# Patient Record
Sex: Male | Born: 1970 | Race: White | Hispanic: No | Marital: Married | Smoking: Former smoker
Health system: Southern US, Community
[De-identification: ages and names within clinical notes are randomized; demographics above are authoritative.]

## PROBLEM LIST (undated history)

## (undated) DIAGNOSIS — T7840XA Allergy, unspecified, initial encounter: Secondary | ICD-10-CM

## (undated) DIAGNOSIS — K4090 Unilateral inguinal hernia, without obstruction or gangrene, not specified as recurrent: Secondary | ICD-10-CM

## (undated) DIAGNOSIS — I10 Essential (primary) hypertension: Secondary | ICD-10-CM

## (undated) DIAGNOSIS — Z800 Family history of malignant neoplasm of digestive organs: Secondary | ICD-10-CM

## (undated) DIAGNOSIS — K219 Gastro-esophageal reflux disease without esophagitis: Secondary | ICD-10-CM

## (undated) DIAGNOSIS — R0981 Nasal congestion: Secondary | ICD-10-CM

## (undated) DIAGNOSIS — Z83719 Family history of colon polyps, unspecified: Secondary | ICD-10-CM

## (undated) DIAGNOSIS — Z8371 Family history of colonic polyps: Secondary | ICD-10-CM

## (undated) HISTORY — DX: Allergy, unspecified, initial encounter: T78.40XA

## (undated) HISTORY — DX: Family history of colon polyps, unspecified: Z83.719

## (undated) HISTORY — DX: Family history of colonic polyps: Z83.71

## (undated) HISTORY — DX: Family history of malignant neoplasm of digestive organs: Z80.0

---

## 1993-10-18 HISTORY — PX: WISDOM TOOTH EXTRACTION: SHX21

## 2013-06-09 ENCOUNTER — Emergency Department (HOSPITAL_COMMUNITY)
Admission: EM | Admit: 2013-06-09 | Discharge: 2013-06-09 | Disposition: A | Discharge status: Home / Self Care | Payer: BC Managed Care – PPO | Attending: Emergency Medicine | Admitting: Emergency Medicine

## 2013-06-09 ENCOUNTER — Encounter (HOSPITAL_COMMUNITY): Payer: Self-pay | Admitting: Emergency Medicine

## 2013-06-09 ENCOUNTER — Emergency Department (HOSPITAL_COMMUNITY): Payer: BC Managed Care – PPO

## 2013-06-09 DIAGNOSIS — S8391XA Sprain of unspecified site of right knee, initial encounter: Secondary | ICD-10-CM

## 2013-06-09 DIAGNOSIS — IMO0002 Reserved for concepts with insufficient information to code with codable children: Secondary | ICD-10-CM | POA: Insufficient documentation

## 2013-06-09 DIAGNOSIS — Y9367 Activity, basketball: Secondary | ICD-10-CM | POA: Insufficient documentation

## 2013-06-09 DIAGNOSIS — R296 Repeated falls: Secondary | ICD-10-CM | POA: Insufficient documentation

## 2013-06-09 DIAGNOSIS — Y929 Unspecified place or not applicable: Secondary | ICD-10-CM | POA: Insufficient documentation

## 2013-06-09 DIAGNOSIS — Z880 Allergy status to penicillin: Secondary | ICD-10-CM | POA: Insufficient documentation

## 2013-06-09 MED ORDER — NAPROXEN 500 MG PO TABS: 500.0000 mg | ORAL_TABLET | Freq: Two times a day (BID) | ORAL | Status: DC

## 2013-06-09 MED ORDER — HYDROCODONE-ACETAMINOPHEN 5-325 MG PO TABS: 1.0000 | ORAL_TABLET | Freq: Four times a day (QID) | ORAL | Status: DC | PRN

## 2013-06-09 NOTE — ED Provider Notes (Signed)
CSN: [id number]     Arrival date & time 06/09/13  1038 History     First MD Initiated Contact with Patient 06/09/13 1042     Chief Complaint  Patient presents with  . Knee Injury   (Consider location/radiation/quality/duration/timing/severity/associated sxs/prior Treatment) HPI Patient is a 42 year old male who presented to emergency department today with complaint of right knee injury. Patient states he injured his knee yesterday while playing basketball. Patient states he went to the lab to get the ball, and states felt like his right knee buckled, and pops, and he ended up falling down to the ground in severe pain. Patient states when he looked at his knee it did not look just like it was dislocated. Patient states he wasn't able to move it or put any weight on it for about 30 minutes. Patient states he applied ice pack and took ibuprofen for it. Patient states that he was able to put weight on it and ambulate with a limp after about half an hour. States today he feels like swelling is getting worse, and pain is not improving. Patient states any time that he takes the wrong turn a wrong step it feels like his knee is gone buccle and "pop out to the outside." Patient denies any prior knee injuries. Patient denies any other complaints. History reviewed. No pertinent past medical history. History reviewed. No pertinent past surgical history. History reviewed. No pertinent family history. History  Substance Use Topics  . Smoking status: Unknown If Ever Smoked  . Smokeless tobacco: Not on file  . Alcohol Use: Not on file    Review of Systems  Constitutional: Negative for fever and chills.  HENT: Negative for neck pain and neck stiffness.   Genitourinary: Negative for dysuria, urgency, frequency and hematuria.  Musculoskeletal: Positive for joint swelling and arthralgias.  Allergic/Immunologic: Negative for immunocompromised state.  Neurological: Negative for dizziness, weakness,  light-headedness, numbness and headaches.    Allergies  Penicillins  Home Medications   Current Outpatient Rx  Name  Route  Sig  Dispense  Refill  . aspirin 325 MG tablet   Oral   Take 325 mg by mouth every 4 (four) hours as needed (headache).         Marland Kitchen ibuprofen (ADVIL,MOTRIN) 200 MG tablet   Oral   Take 600 mg by mouth every 6 (six) hours as needed for pain.         . Oxycodone-Acetaminophen (PERCOCET PO)   Oral   Take 1 tablet by mouth daily as needed (pain).          BP 129/87  Pulse 62  Temp(Src) 98.5 F (36.9 C) (Oral)  Resp 16  SpO2 99% Physical Exam  Nursing note and vitals reviewed. Constitutional: He appears well-developed and well-nourished. No distress.  HENT:  Head: Normocephalic and atraumatic.  Eyes: Conjunctivae are normal.  Neck: Neck supple.  Musculoskeletal: He exhibits no edema.  Normal appearing right knee. Tender to palpation over lateral joint. Full ROM of the knee joint. Pain with full flexion and extension. Negative anterior and posterior drawer signs. No laxity  with medial or lateral stress. Pain with medial stress. Dorsal pedal pulses intact  Neurological: He is alert.  Skin: Skin is warm and dry.    ED Course   Procedures (including critical care time)  Labs Reviewed - No data to display Dg Knee Complete 4 Views Right  06/09/2013   CLINICAL DATA:  Lateral knee pain after injury yesterday playing basketball.  EXAM:  RIGHT KNEE - COMPLETE 4+ VIEW  COMPARISON:  None.  FINDINGS: Small but abnormal knee effusion. No fracture or acute bony findings identified.  IMPRESSION: 1. Small but abnormal knee effusion.   Electronically Signed   By: Herbie Baltimore   On: 06/09/2013 11:32   1. Right knee sprain, initial encounter     MDM  Right knee injury yesterday. Patient's x-ray shows small knee effusion no other abnormalities. The knee is stable on exam however patient states it does feel unstable when he is walking. I am concerned for  possible ligament injury. Will apply a knee immobilizer, he has his own crutches she will be using. Will discharge home with Naprosyn, Norco, and followup with orthopedics.  Filed Vitals:   06/09/13 1048  BP: 129/87  Pulse: 62  Temp: 98.5 F (36.9 C)  TempSrc: Oral  Resp: 16  SpO2: 99%     Vanessia Bokhari A Letoya Stallone, PA-C 06/09/13 1406

## 2013-06-09 NOTE — ED Notes (Signed)
Pt returned from X-ray.  

## 2013-06-09 NOTE — ED Provider Notes (Signed)
Medical screening examination/treatment/procedure(s) were performed by non-physician practitioner and as supervising physician I was immediately available for consultation/collaboration.   Dagmar Hait, MD 06/09/13 [phone]

## 2013-06-09 NOTE — ED Notes (Signed)
Pt coming from home c/o rt knee pain from playing basketball and felt knee "pop". Pt states " I think it popped out". Pt is able to bare weight but feels like it is going to pop out when straightening.Took 3 200mg  ibuprofen 2 hrs ago. Pt used ice to help with swelling.

## 2013-06-09 NOTE — ED Notes (Signed)
Crutch instructions given-pt stated understood.

## 2013-06-25 ENCOUNTER — Ambulatory Visit: Payer: Self-pay | Admitting: Orthopedic Surgery

## 2014-03-18 IMAGING — CR DG KNEE COMPLETE 4+V*R*
4 series · 4 of 4 positions shown · non-contrast
Comparison: None.

CLINICAL DATA: Lateral knee pain after injury yesterday playing
basketball.

EXAM:
RIGHT KNEE - COMPLETE 4+ VIEW

[t knee ap right]
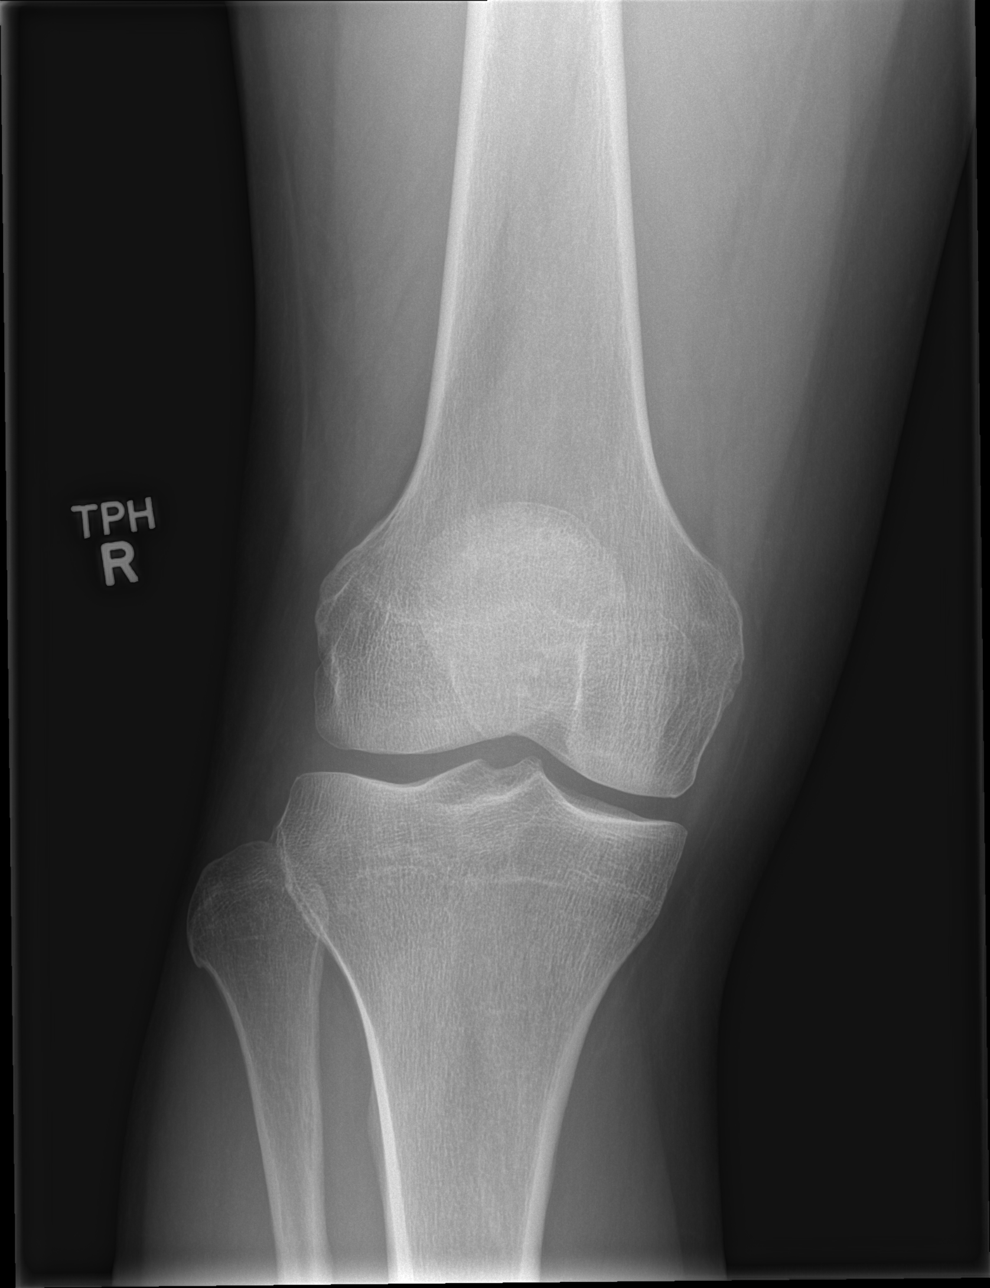

[t knee obl right (1 of 2)]
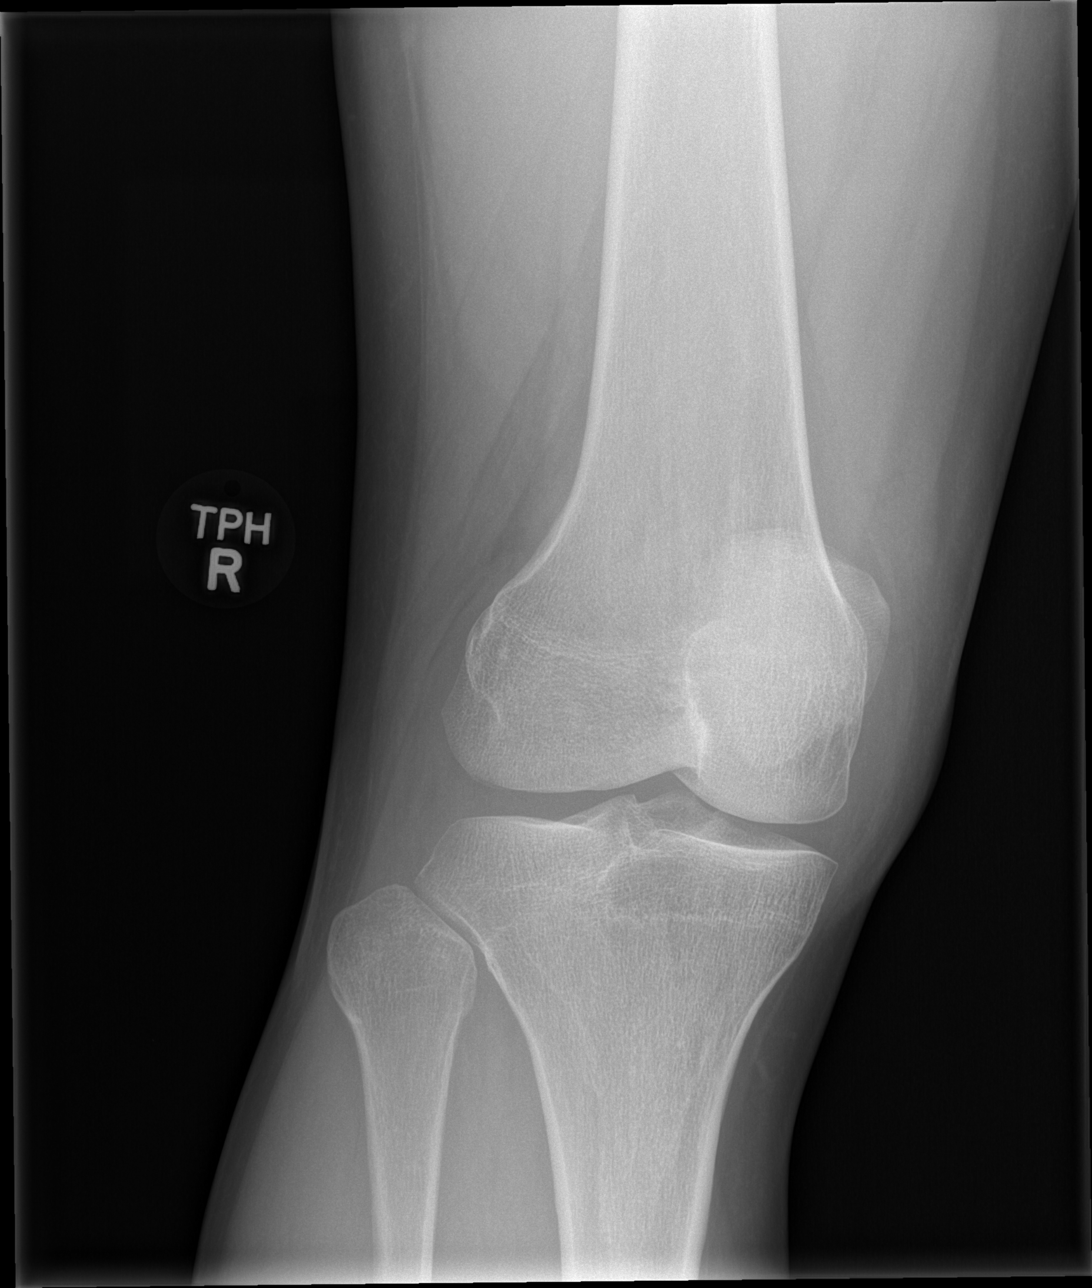

[t knee obl right (2 of 2)]
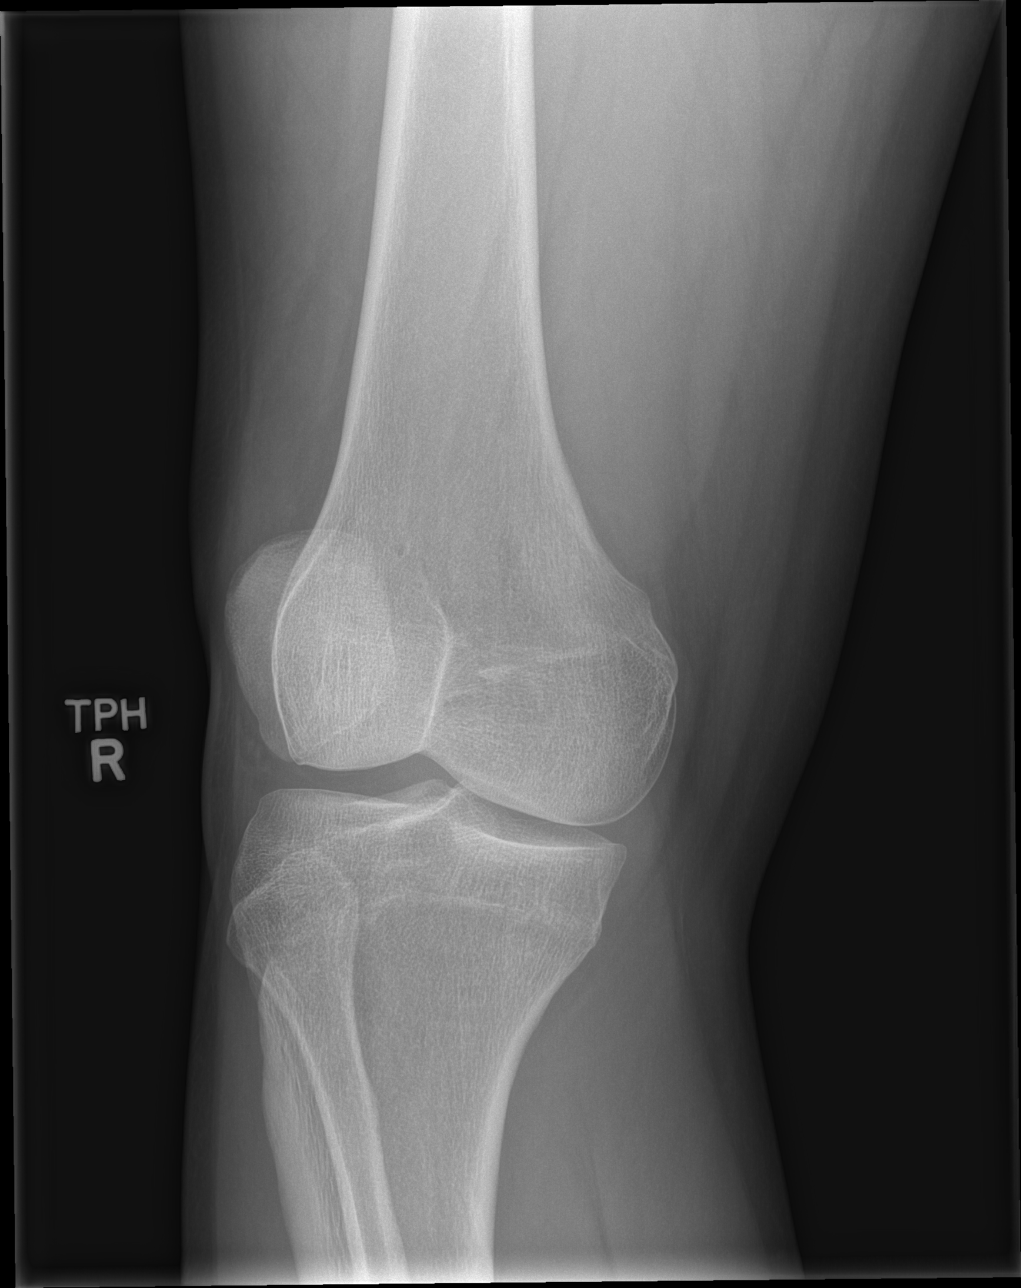

[t knee lat right]
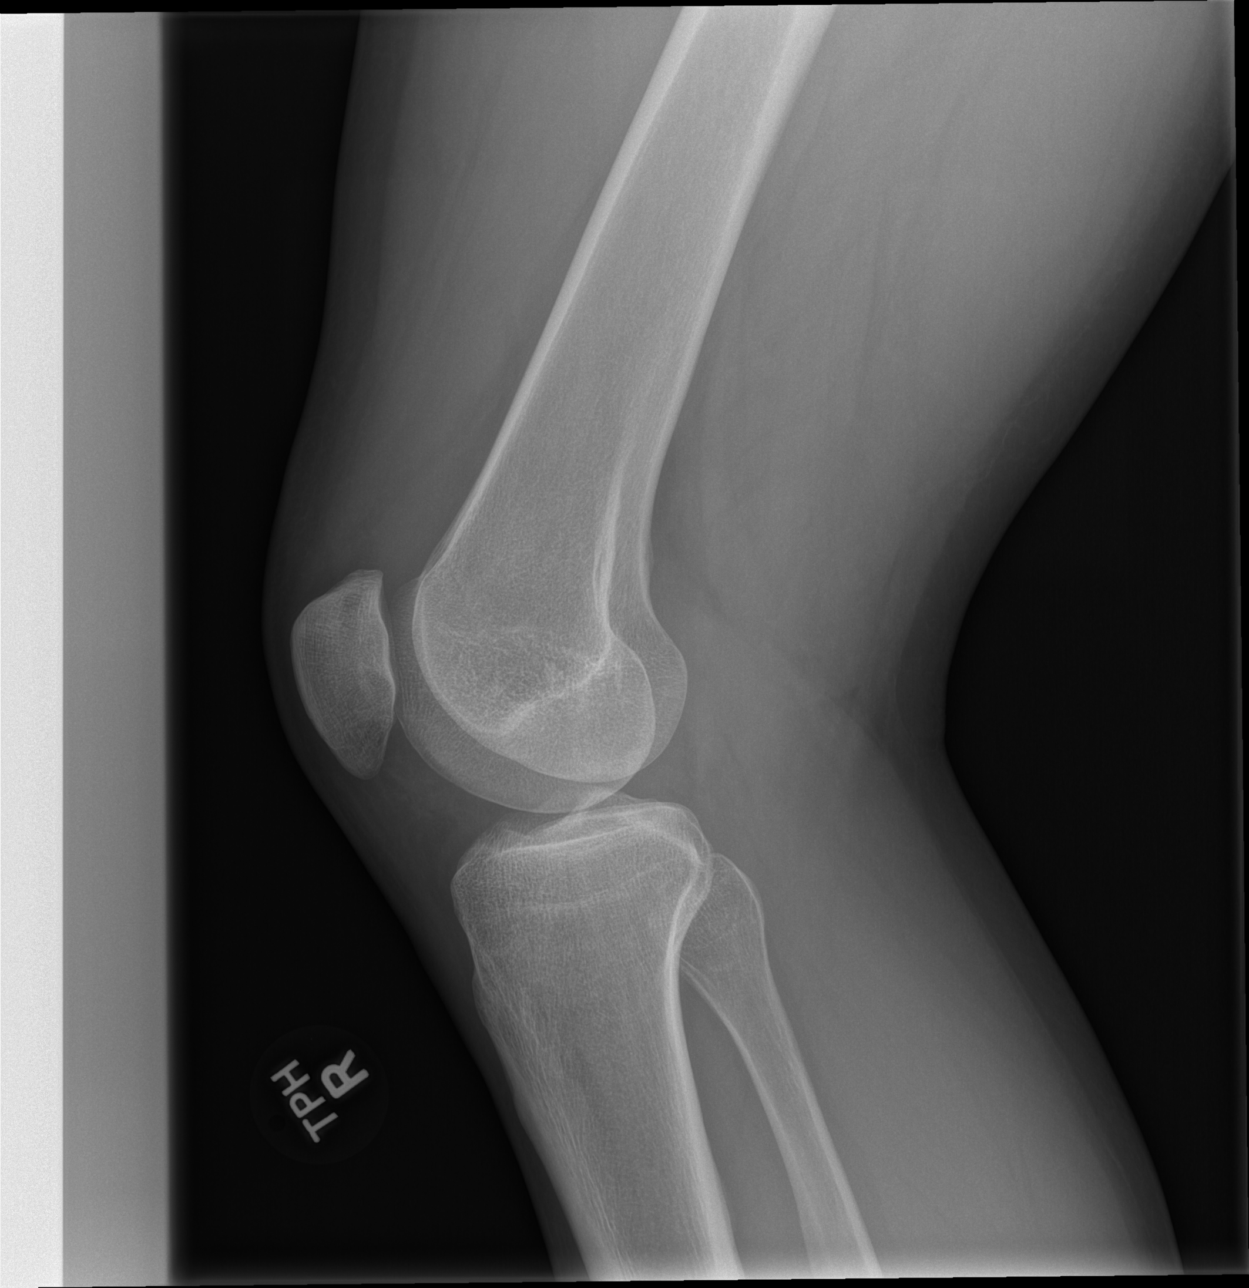

[4 of 4 positions shown; findings below may reference images not displayed]

FINDINGS: Small but abnormal knee effusion. No fracture or acute bony findings
identified.
IMPRESSION: 1. Small but abnormal knee effusion.

## 2016-01-27 ENCOUNTER — Encounter: Payer: Self-pay | Admitting: *Deleted

## 2016-02-17 ENCOUNTER — Encounter: Payer: Self-pay | Admitting: General Surgery

## 2016-02-17 ENCOUNTER — Ambulatory Visit (INDEPENDENT_AMBULATORY_CARE_PROVIDER_SITE_OTHER): Payer: Self-pay | Admitting: General Surgery

## 2016-02-17 VITALS — BP 140/90 | HR 78 | Resp 14 | Ht 71.0 in | Wt 243.0 lb

## 2016-02-17 DIAGNOSIS — K439 Ventral hernia without obstruction or gangrene: Secondary | ICD-10-CM

## 2016-02-17 NOTE — Progress Notes (Addendum)
Patient ID: Keith York, male   DOB: Aug 13, 1971, 45 y.o.   MRN: [id number]  Chief Complaint  Patient presents with  . Hernia    HPI Keith York is a 45 y.o. male.  Here today for evaluation of possible hernia. He states he has a knot when he stands up at his belly button that he noticed in March. He states that around Christmas he had an episode of abdominal pain/ "pressure", fever and nausea with shortness of breath, but no knot. He has had several more episodes but not as severe, where he has to lay down and relax before he feels better.  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  He is here today with his wife, Keith York of 20 years. Four children- 16, 14 and twins are 9.  The patient was accompanied by his wife of 20 years, Keith York.  I personally reviewed the patient's history.  HPI  Past Medical History  Diagnosis Date  . Allergy     Past Surgical History  Procedure Laterality Date  . [place] tooth extraction  1995    Family History  Problem Relation Age of Onset  . Arthritis/Rheumatoid Father   . Dementia Father     Social History Social History  Substance Use Topics  . Smoking status: Former Research scientist (life sciences)  . Smokeless tobacco: Never Used  . Alcohol Use: No    Allergies  Allergen Reactions  . Penicillins Rash    Current Outpatient Prescriptions  Medication Sig Dispense Refill  . ibuprofen (ADVIL,MOTRIN) 200 MG tablet Take 600 mg by mouth every 6 (six) hours as needed for pain.    Marland Kitchen loratadine (CLARITIN) 10 MG tablet Take 10 mg by mouth daily.     No current facility-administered medications for this visit.    Review of Systems Review of Systems  Constitutional: Negative.   Respiratory: Negative.   Cardiovascular: Negative.   Gastrointestinal: Positive for abdominal pain.    Blood pressure 140/90, pulse 78, resp. rate 14, height 5\' 11"  (1.803 m), weight 243 lb (110.224 kg).  Physical Exam Physical  Exam  Constitutional: He is oriented to person, place, and time. He appears well-developed and well-nourished.  HENT:  Mouth/Throat: Oropharynx is clear and moist.  Eyes: Conjunctivae are normal. No scleral icterus.  Neck: Neck supple.  Cardiovascular: Normal rate, regular rhythm and normal heart sounds.   Pulmonary/Chest: Effort normal and breath sounds normal.  Abdominal: Soft. Normal appearance and bowel sounds are normal. There is no tenderness. A hernia is present.    Epigastric hernia present.  Lymphadenopathy:    He has no cervical adenopathy.  Neurological: He is alert and oriented to person, place, and time.  Skin: Skin is warm and dry.  Psychiatric: His behavior is normal.    Data Reviewed 01/22/2016: Ultrasound of the abdominal wall showed a soft tissue mass containing adipose tissue with internal vascular flow.  Assessment    Epigastric hernia.    Plan    No anticipated risk for incarceration as this is likely a pre-peritoneal fat pad protruding through a small fascial defect.     Hernia precautions and incarceration were discussed with the patient. If they develop symptoms of an incarcerated hernia, they were encouraged to seek prompt medical attention.  I have recommended repair of the hernia using mesh on an outpatient basis in the near future. The risk of infection was reviewed. The role of prosthetic mesh to  minimize the risk of recurrence was reviewed.  He will need to avoid strenuous activity for 3 weeks.  Patient to be contacted once August surgery schedule opens.   PCP:  Keith York Ref Keith Pulling PA  This information has been scribed by Keith Fetch RN, BSN,BC.   Keith York 02/24/2016, 5:15 PM

## 2016-02-17 NOTE — Patient Instructions (Addendum)
The patient is aware to call back for any questions or concerns. Hernia, Adult A hernia is the bulging of an organ or tissue through a weak spot in the muscles of the abdomen (abdominal wall). Hernias develop most often near the navel or groin. There are many kinds of hernias. Common kinds include:  Femoral hernia. This kind of hernia develops under the groin in the upper thigh area.  Inguinal hernia. This kind of hernia develops in the groin or scrotum.  Umbilical hernia. This kind of hernia develops near the navel.  Hiatal hernia. This kind of hernia causes part of the stomach to be pushed up into the chest.  Incisional hernia. This kind of hernia bulges through a scar from an abdominal surgery. CAUSES This condition may be caused by:  Heavy lifting.  Coughing over a long period of time.  Straining to have a bowel movement.  An incision made during an abdominal surgery.  A birth defect (congenital defect).  Excess weight or obesity.  Smoking.  Poor nutrition.  Cystic fibrosis.  Excess fluid in the abdomen.  Undescended testicles. SYMPTOMS Symptoms of a hernia include:  A lump on the abdomen. This is the first sign of a hernia. The lump may become more obvious with standing, straining, or coughing. It may get bigger over time if it is not treated or if the condition causing it is not treated.  Pain. A hernia is usually painless, but it may become painful over time if treatment is delayed. The pain is usually dull and may get worse with standing or lifting heavy objects. Sometimes a hernia gets tightly squeezed in the weak spot (strangulated) or stuck there (incarcerated) and causes additional symptoms. These symptoms may include:  Vomiting.  Nausea.  Constipation.  Irritability. DIAGNOSIS A hernia may be diagnosed with:  A physical exam. During the exam your health care provider may ask you to cough or to make a specific movement, because a hernia is usually  more visible when you move.  Imaging tests. These can include:  X-rays.  Ultrasound.  CT scan. TREATMENT A hernia that is small and painless may not need to be treated. A hernia that is large or painful may be treated with surgery. Inguinal hernias may be treated with surgery to prevent incarceration or strangulation. Strangulated hernias are always treated with surgery, because lack of blood to the trapped organ or tissue can cause it to die. Surgery to treat a hernia involves pushing the bulge back into place and repairing the weak part of the abdomen. HOME CARE INSTRUCTIONS  Avoid straining.  Do not lift anything heavier than 10 lb (4.5 kg).  Lift with your leg muscles, not your back muscles. This helps avoid strain.  When coughing, try to cough gently.  Prevent constipation. Constipation leads to straining with bowel movements, which can make a hernia worse or cause a hernia repair to break down. You can prevent constipation by:  Eating a high-fiber diet that includes plenty of fruits and vegetables.  Drinking enough fluids to keep your urine clear or pale yellow. Aim to drink 6-8 glasses of water per day.  Using a stool softener as directed by your health care provider.  Lose weight, if you are overweight.  Do not use any tobacco products, including cigarettes, chewing tobacco, or electronic cigarettes. If you need help quitting, ask your health care provider.  Keep all follow-up visits as directed by your health care provider. This is important. Your health care provider may   need to monitor your condition. SEEK MEDICAL CARE IF:  You have swelling, redness, and pain in the affected area.  Your bowel habits change. SEEK IMMEDIATE MEDICAL CARE IF:  You have a fever.  You have abdominal pain that is getting worse.  You feel nauseous or you vomit.  You cannot push the hernia back in place by gently pressing on it while you are lying down.  The hernia:  Changes in  shape or size.  Is stuck outside the abdomen.  Becomes discolored.  Feels hard or tender.   This information is not intended to replace advice given to you by your health care provider. Make sure you discuss any questions you have with your health care provider.   Document Released: 10/04/2005 Document Revised: 10/25/2014 Document Reviewed: 08/14/2014 Elsevier Interactive Patient Education 2016 [employer].  Patient to be contacted once August surgery schedule opens.

## 2016-02-18 DIAGNOSIS — K439 Ventral hernia without obstruction or gangrene: Secondary | ICD-10-CM | POA: Insufficient documentation

## 2016-02-20 ENCOUNTER — Telehealth: Payer: Self-pay | Admitting: *Deleted

## 2016-02-20 NOTE — Telephone Encounter (Signed)
Patient wants you to call him regarding his umbilical hernia. For the last couple of days he has been experiencing deep sharpe pain in the abdominal area and is bloated. He is worried that it could be more than just a hernia.

## 2016-03-05 ENCOUNTER — Encounter: Payer: Self-pay | Admitting: General Surgery

## 2016-04-29 ENCOUNTER — Telehealth: Payer: Self-pay | Admitting: *Deleted

## 2016-04-29 NOTE — Telephone Encounter (Signed)
Patient's wife, Chidi Chann, was contacted today to arrange epigastric hernia repair for September 2017.  This patient's surgery has been scheduled for 06-23-16 at [facility]. Instructions reviewed by phone today. Patient's wife instructed to call the office should they have further questions.

## 2016-06-11 ENCOUNTER — Other Ambulatory Visit: Payer: Self-pay | Admitting: General Surgery

## 2016-06-16 ENCOUNTER — Encounter
Admission: RE | Admit: 2016-06-16 | Discharge: 2016-06-16 | Disposition: A | Discharge status: Home / Self Care | Payer: Self-pay | Source: Ambulatory Visit | Attending: General Surgery | Admitting: General Surgery

## 2016-06-16 HISTORY — DX: Nasal congestion: R09.81

## 2016-06-16 NOTE — Patient Instructions (Signed)
  Your procedure is scheduled on: 06/23/16 Report to Day Surgery. [facility] SECOND FLOOR To find out your arrival time please call [phone] between 1PM - 3PM on 06/22/16  Remember: Instructions that are not followed completely may result in serious medical risk, up to and including death, or upon the discretion of your surgeon and anesthesiologist your surgery may need to be rescheduled.    __X__ 1. Do not eat food or drink liquids after midnight. No gum chewing or hard candies.     _X___ 2. No Alcohol for 24 hours before or after surgery.   ____ 3. Do Not Smoke For 24 Hours Prior to Your Surgery.   ____ 4. Bring all medications with you on the day of surgery if instructed.    __X__ 5. Notify your doctor if there is any change in your medical condition     (cold, fever, infections).       Do not wear jewelry, make-up, hairpins, clips or nail polish.  Do not wear lotions, powders, or perfumes. You may wear deodorant.  Do not shave 48 hours prior to surgery. Men may shave face and neck.  Do not bring valuables to the hospital.    [facility] is not responsible for any belongings or valuables.               Contacts, dentures or bridgework may not be worn into surgery.  Leave your suitcase in the car. After surgery it may be brought to your room.  For patients admitted to the hospital, discharge time is determined by your                treatment team.   Patients discharged the day of surgery will not be allowed to drive home.   Please read over the following fact sheets that you were given:   Surgical Site Infection Prevention   ____ Take these medicines the morning of surgery with A SIP OF WATER:    1. NONE  2.   3.   4.  5.  6.  ____ Fleet Enema (as directed)   _X___ Use CHG Soap as directed  ____ Use inhalers on the day of surgery  ____ Stop metformin 2 days prior to surgery    ____ Take 1/2 of usual insulin dose the night before surgery and none on the morning  of surgery.   ____ Stop Coumadin/Plavix/aspirin on   ____ Stop Anti-inflammatories on     ALREADY STOPPED IBUPROFEN   ____ Stop supplements until after surgery.    ____ Bring C-Pap to the hospital.

## 2016-06-22 ENCOUNTER — Ambulatory Visit: Admission: RE | Admit: 2016-06-22 | Payer: Self-pay | Source: Ambulatory Visit | Admitting: General Surgery

## 2016-06-22 ENCOUNTER — Encounter: Admission: RE | Payer: Self-pay | Source: Ambulatory Visit

## 2016-06-22 SURGERY — REPAIR, HERNIA, EPIGASTRIC, ADULT
Anesthesia: Choice

## 2016-07-02 ENCOUNTER — Telehealth: Payer: Self-pay

## 2016-07-02 NOTE — Telephone Encounter (Signed)
Call to patient's wife to let her know that our November schedule was open. The patient is now scheduled for surgery at [facility] on 09/14/16. He will pre admit by phone. The patient is aware to call the day before to get his arrival time. He is aware of date and instructions.

## 2016-07-16 ENCOUNTER — Other Ambulatory Visit: Payer: Self-pay | Admitting: General Surgery

## 2016-08-31 ENCOUNTER — Telehealth: Payer: Self-pay

## 2016-08-31 NOTE — Telephone Encounter (Signed)
Patient's wife called to cancel his surgery. 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 She will call back to reschedule once they can find a time for this.

## 2016-09-06 ENCOUNTER — Other Ambulatory Visit: Payer: Self-pay

## 2016-09-14 ENCOUNTER — Ambulatory Visit: Admit: 2016-09-14 | Payer: Self-pay | Admitting: General Surgery

## 2016-09-14 SURGERY — REPAIR, HERNIA, EPIGASTRIC, ADULT
Anesthesia: Choice

## 2020-03-10 ENCOUNTER — Other Ambulatory Visit: Payer: Self-pay | Admitting: Otolaryngology

## 2020-03-10 DIAGNOSIS — R221 Localized swelling, mass and lump, neck: Secondary | ICD-10-CM

## 2020-03-12 ENCOUNTER — Ambulatory Visit
Admission: RE | Admit: 2020-03-12 | Discharge: 2020-03-12 | Disposition: A | Discharge status: Home / Self Care | Payer: Self-pay | Source: Ambulatory Visit | Attending: Otolaryngology | Admitting: Otolaryngology

## 2020-03-12 ENCOUNTER — Other Ambulatory Visit: Payer: Self-pay

## 2020-03-12 DIAGNOSIS — R221 Localized swelling, mass and lump, neck: Secondary | ICD-10-CM

## 2020-03-12 MED ORDER — IOHEXOL 300 MG/ML  SOLN
75.0000 mL | Freq: Once | INTRAMUSCULAR | Status: AC | PRN
  Administered 2020-03-12: 75 mL via INTRAVENOUS

## 2020-11-18 ENCOUNTER — Other Ambulatory Visit: Payer: Self-pay | Admitting: General Surgery

## 2020-11-18 DIAGNOSIS — Z1509 Genetic susceptibility to other malignant neoplasm: Secondary | ICD-10-CM

## 2020-12-08 ENCOUNTER — Inpatient Hospital Stay: Payer: Self-pay

## 2020-12-08 ENCOUNTER — Encounter: Payer: Self-pay | Admitting: Licensed Clinical Social Worker

## 2020-12-08 ENCOUNTER — Inpatient Hospital Stay: Payer: Self-pay | Attending: Oncology | Admitting: Licensed Clinical Social Worker

## 2020-12-08 DIAGNOSIS — Z8371 Family history of colonic polyps: Secondary | ICD-10-CM

## 2020-12-08 DIAGNOSIS — Z800 Family history of malignant neoplasm of digestive organs: Secondary | ICD-10-CM

## 2020-12-08 NOTE — Progress Notes (Signed)
REFERRING PROVIDER: Robert Bellow, MD [place],  [place] [id number]  PRIMARY PROVIDER:  Patient, No Pcp Per  PRIMARY REASON FOR VISIT:  1. Family history of colonic polyps   2. Family history of Lynch syndrome      HISTORY OF PRESENT ILLNESS:   Keith York, a 50 y.o. male, was seen for a [place] cancer genetics consultation at the request of Dr. Bary Castilla due to a family history of Lynch syndrome.  Keith York presents to clinic today to discuss the possibility of a hereditary predisposition to cancer, genetic testing, and to further clarify his future cancer risks, as well as potential cancer risks for family members.    Keith York is a 50 y.o. male with no personal history of cancer.  He has had normal PSA screening in the past. He has a colonoscopy scheduled for 2/28.  CANCER HISTORY:  Oncology History   No history exists.     Past Medical History:  Diagnosis Date  . Allergy   . Family history of colonic polyps   . Family history of Lynch syndrome   . Sinus congestion     Past Surgical History:  Procedure Laterality Date  . [place] EXTRACTION  1995    Social History   Socioeconomic History  . Marital status: Married    Spouse name: Not on file  . Number of children: Not on file  . Years of education: Not on file  . Highest education level: Not on file  Occupational History  . Not on file  Tobacco Use  . Smoking status: Former Smoker    Quit date: 06/16/1994    Years since quitting: 26.4  . Smokeless tobacco: Never Used  Substance and Sexual Activity  . Alcohol use: No    Alcohol/week: 0.0 standard drinks  . Drug use: No  . Sexual activity: Not on file  Other Topics Concern  . Not on file  Social History Narrative  . Not on file   Social Determinants of Health   Financial Resource Strain: Not on file  Food Insecurity: Not on file  Transportation Needs: Not on file  Physical Activity: Not on file  Stress: Not on file   Social Connections: Not on file     FAMILY HISTORY:  We obtained a detailed, 4-generation family history.  Significant diagnoses are listed below: Family History  Problem Relation Age of Onset  . Arthritis/Rheumatoid Father   . Dementia Father   . Colon polyps Brother        PMS2+ Lynch syndrome  . Lung cancer Maternal Grandmother    Keith York has 3 daughters and 1 son. He has 1 full brother, 1 maternal half brother. His full brother has had >20 colon polyps and underwent genetic testing that revealed a PMS2 mutation.   Keith York mother is living at 24, no cancer. No maternal aunts/uncles. Maternal grandmother died at 28, lung cancer. Maternal grandfather died in his 49s.   Keith York father died of dementia in his 66s. Patient has 1 paternal uncle, no cancer. No known cancers in paternal cousins. Paternal grandparents passed in their 79s.   Keith York is aware of previous family history of genetic testing for hereditary cancer risks. Patient's maternal ancestors are of [place] descent, and paternal ancestors are of [place] descent. There is no reported Ashkenazi Jewish ancestry. There is no known consanguinity.    GENETIC COUNSELING ASSESSMENT: Keith York is a 50 y.o. male with a family history  of Lynch syndrome. We, therefore, discussed and recommended the following at today's visit.   DISCUSSION: DISCUSSION: We discussed that 5-7% of colon cancer is hereditary, with most cases due to Lynch Syndrome, also called HNPCC (Hereditary Non-Polyposis Colon Cancer).  This syndrome increases the risk for colon, uterine, ovarian and stomach cancers, brain cancers, as well as others. Families with Lynch Syndrome tend to have multiple family members with these cancers, typically diagnosed under age 53, and diagnoses in multiple generations. The genes that are known to cause Lynch Syndrome are called MLH1, MSH2, MSH6, PMS2 and EPCAM. The mutation in the family is in the PMS2 gene.  We  reviewed the characteristics, features and inheritance patterns of hereditary cancer syndromes. We also discussed genetic testing, including the appropriate family members to test, the process of testing, insurance coverage and turn-around-time for results. We discussed the implications of a negative, positive and/or variant of uncertain significant result. We recommended Keith York pursue genetic testing for the PMS2 mutation through [facility].   Based on Keith York family history of Donnal York, he meets medical criteria for genetic testing. Despite that he meets criteria, he may still have an out of pocket cost. We discussed that if his out of pocket cost for testing is over $100, the laboratory will call and confirm whether he wants to proceed with testing.  If the out of pocket cost of testing is less than $100 he will be billed by the genetic testing laboratory.   PLAN: After considering the risks, benefits, and limitations, Keith York provided informed consent to pursue genetic testing and the blood sample was sent to [facility] for analysis of the PMS2 gene. Results should be available within approximately 2-3 weeks' time, at which point they will be disclosed by telephone to Keith York, as will any additional recommendations warranted by these results. Keith York will receive a summary of his genetic counseling visit and a copy of his results once available. This information will also be available in Epic.   Keith York were answered to his satisfaction today. Our contact information was provided should additional York or concerns arise. Thank you for the referral and allowing [place] to share in the care of your patient.   Faith Rogue, MS, [facility] Genetic Counselor [email or web address] Phone: [phone]  The patient was seen for a total of 35 minutes in face-to-face genetic counseling. Patient's wife Steffanie Dunn was also present. Dr. Grayland Ormond was available for discussion  regarding this case.   _______________________________________________________________________ For Office Staff:  Number of people involved in session: 2 Was an Intern/ student involved with case: no

## 2020-12-19 IMAGING — CT CT NECK W/ CM
2 of 3 series · 8 of 14 positions shown, 10 images · IV contrast (omnipaque)
Comparison: None.

CLINICAL DATA: Left neck swelling.

EXAM:
CT NECK WITH CONTRAST
TECHNIQUE: Multidetector CT imaging of the neck was performed using the
standard protocol following the bolus administration of intravenous
contrast.
CONTRAST:  75mL OMNIPAQUE IOHEXOL 300 MG/ML  SOLN

[Series 2: axial neck · axial · 0.62mm/px · z∈[-461,-329]mm · 3 of 132 slices shown]
[im 33/132  bone]
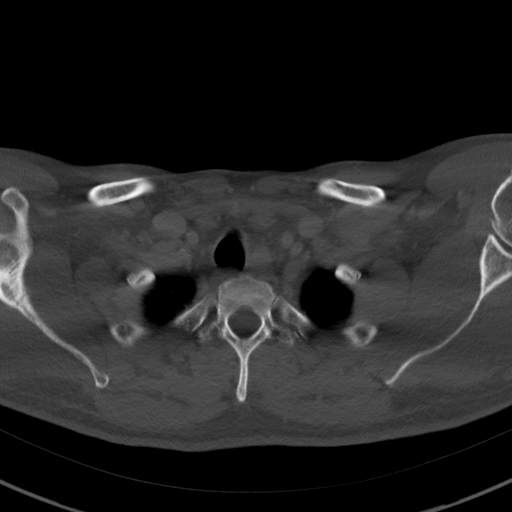
[im 66/132  bone]
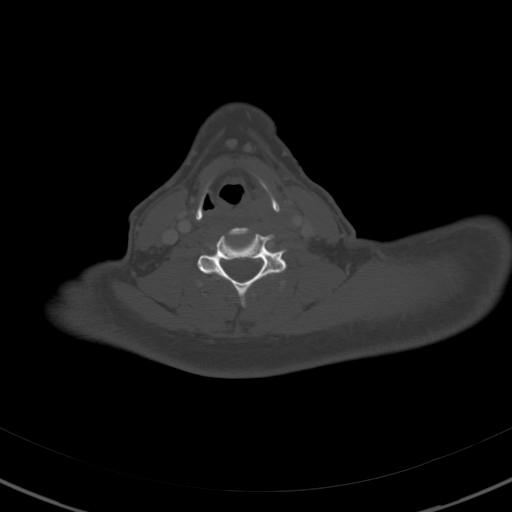
[im 99/132  bone]
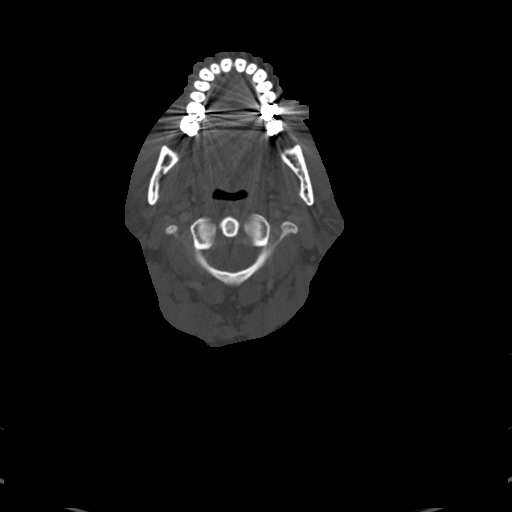

[Series 7: orthogonal ax · axial · 0.36mm/px · z∈[-525,-316]mm · 5 of 161 slices shown, 7 images]
[im 27/161  soft-tissue]
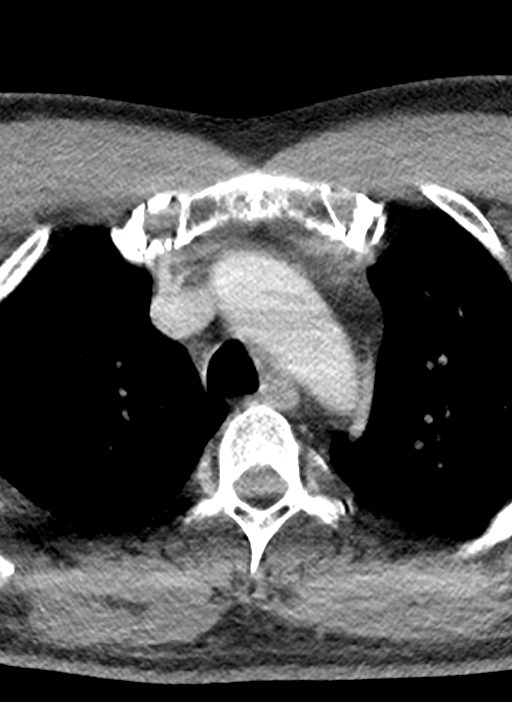
[im 27/161  bone]
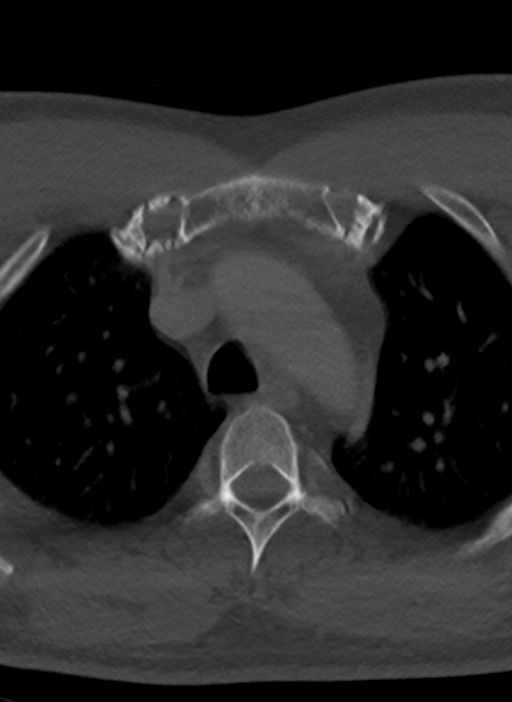
[im 54/161  bone]
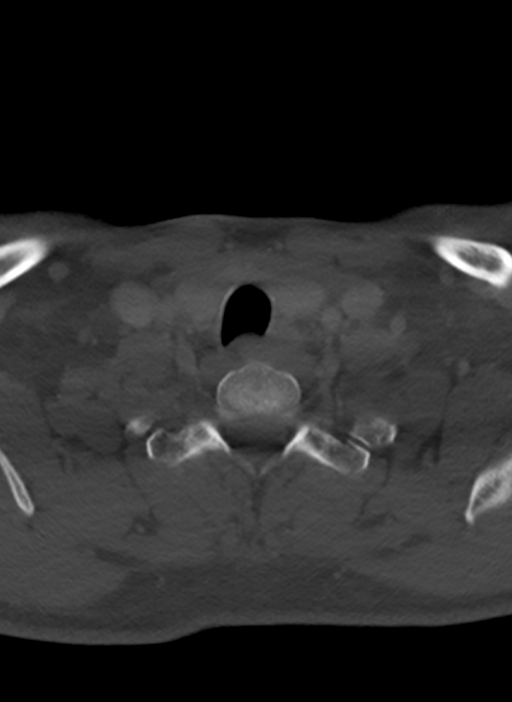
[im 81/161  bone]
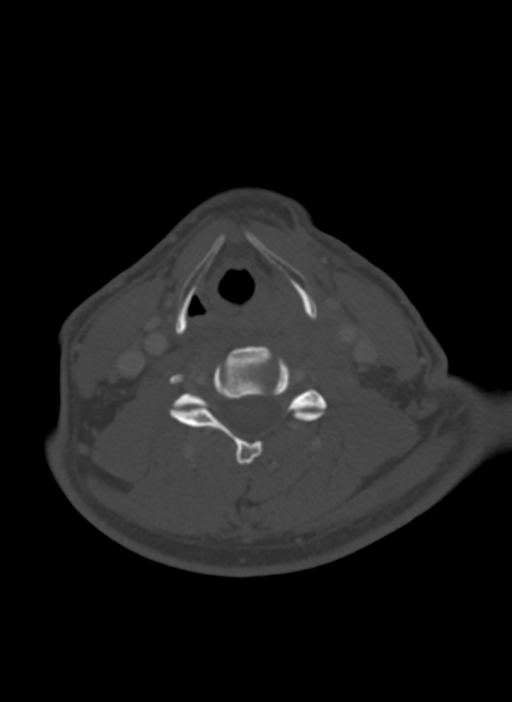
[im 107/161  bone]
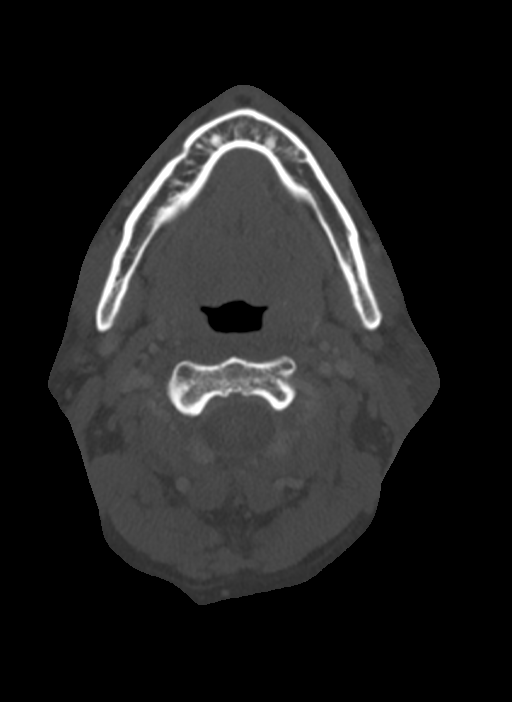
[im 134/161  soft-tissue]
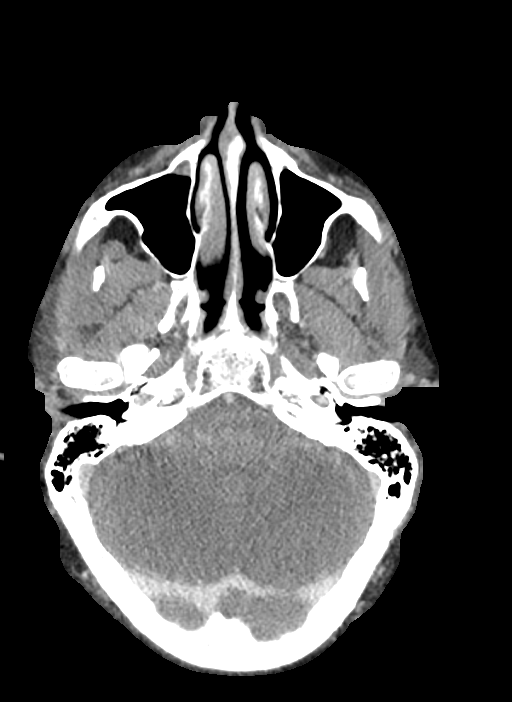
[im 134/161  bone]
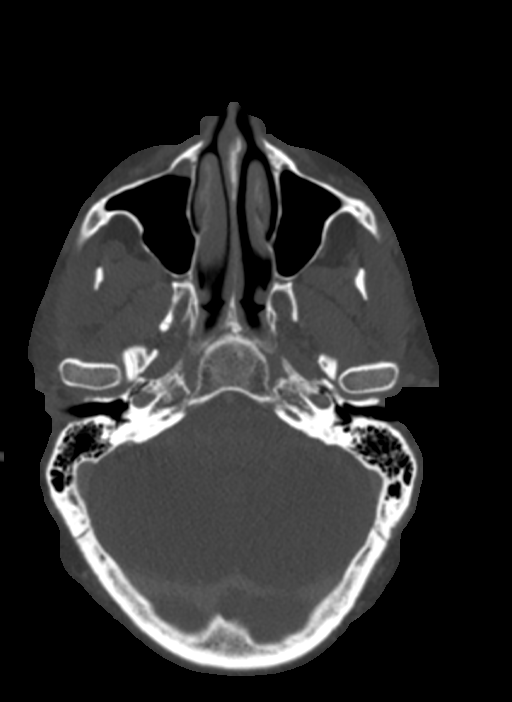

[8 of 14 positions shown; findings below may reference images not displayed]

FINDINGS: Pharynx and larynx: There is a small retropharyngeal effusion. More
laterally at the level of and surrounding the left superior cornu of
the thyroid cartilage, there is a more focal region of
heterogeneously intermediate to low density soft tissue measuring
2.5 cm which mildly laterally displaces the left common carotid
artery but does not completely encircle or narrow it. Soft tissue
thickening partially extends more inferiorly primarily along the
deep aspect of the thyroid cartilage, and the left piriform sinus is
partially effaced.

Salivary glands: No inflammation, mass, or stone.

Thyroid: Unremarkable.

Lymph nodes: No enlarged or suspicious lymph nodes in the neck.

Vascular: Major vascular structures of the neck are patent.

Limited intracranial: Unremarkable.

Visualized orbits: Unremarkable.

Mastoids and visualized paranasal sinuses: Minimal right maxillary
sinus mucosal thickening. Clear mastoid air cells.

Skeleton: Mild cervical spondylosis and upper right cervical facet
arthrosis.

Upper chest: Clear lung apices.

Other: None.
IMPRESSION: 2.5 cm region of soft tissue surrounding the left superior cornu of
the thyroid cartilage. A neoplasm (such as chondroma) is possible,
however given that the underlying thyroid cartilage is intact and
there is a small retropharyngeal effusion, an inflammatory process
is also a consideration.

## 2021-01-01 ENCOUNTER — Ambulatory Visit: Payer: Self-pay | Admitting: Licensed Clinical Social Worker

## 2021-01-01 ENCOUNTER — Encounter: Payer: Self-pay | Admitting: Licensed Clinical Social Worker

## 2021-01-01 DIAGNOSIS — Z1379 Encounter for other screening for genetic and chromosomal anomalies: Secondary | ICD-10-CM

## 2021-01-01 DIAGNOSIS — Z800 Family history of malignant neoplasm of digestive organs: Secondary | ICD-10-CM

## 2021-01-01 DIAGNOSIS — Z8371 Family history of colonic polyps: Secondary | ICD-10-CM

## 2021-01-01 NOTE — Progress Notes (Signed)
HPI:  Mr. Vandenbrink was previously seen in the [facility] due to a family history of Lynch syndrome and concerns regarding a hereditary predisposition to cancer. Please refer to our prior cancer genetics clinic note for more information regarding our discussion, assessment and recommendations, at the time. Mr. Bilbo's recent genetic test results were disclosed to him, as were recommendations warranted by these results. These results and recommendations are discussed in more detail below.  CANCER HISTORY:  Oncology History   No history exists.    FAMILY HISTORY:  We obtained a detailed, 4-generation family history.  Significant diagnoses are listed below: Family History  Problem Relation Age of Onset  . Arthritis/Rheumatoid Father   . Dementia Father   . Colon polyps Brother        PMS2+ Lynch syndrome  . Lung cancer Maternal Grandmother     Mr. Gupton has 3 daughters and 1 son. He has 1 full brother, 1 maternal half brother. His full brother has had >20 colon polyps and underwent genetic testing that revealed a PMS2 mutation.   Mr. Platas's mother is living at 76, no cancer. No maternal aunts/uncles. Maternal grandmother died at 93, lung cancer. Maternal grandfather died in his 60s.   Mr. Paget's father died of dementia in his 50s. Patient has 1 paternal uncle, no cancer. No known cancers in paternal cousins. Paternal grandparents passed in their 70s.   Mr. Nilsson is aware of previous family history of genetic testing for hereditary cancer risks. Patient's maternal ancestors are of German descent, and paternal ancestors are of Scottish descent. There is no reported Ashkenazi Jewish ancestry. There is no known consanguinity.    GENETIC TEST RESULTS: Genetic testing reported out on 12/31/2020 through the [facility] test which looked at the PMS2 gene only.  .   The test report has been scanned into EPIC and is located under the Molecular  Pathology section of the Results Review tab.  A portion of the result report is included below for reference.     We recommended Mr. Viscuso pursue testing for the familial hereditary cancer gene mutation called PMS2 [id number]. Mr. Romo's test was normal and did not reveal the familial mutation. We call this result a true negative result because the cancer-causing mutation was identified in Mr. Pinon's family, and he did not inherit it.  Given this negative result, Mr. Auzenne's chances of developing PMS2-related cancers are the same as they are in the general population.    ADDITIONAL GENETIC TESTING: We discussed with Mr. Angus that there are other genes that are associated with increased cancer risk that can be analyzed. Should Mr. Peets wish to pursue additional genetic testing, we are happy to discuss and coordinate this testing, at any time.    CANCER SCREENING RECOMMENDATIONS: Mr. Lias's test result is considered negative (normal).    While reassuring, this does not definitively rule out a hereditary predisposition to cancer. It is still possible that there could be genetic mutations that are undetectable by current technology. There could be genetic mutations in genes that have not been tested or identified to increase cancer risk.  Therefore, it is recommended he continue to follow the cancer management and screening guidelines provided by his  primary healthcare provider.   An individual's cancer risk and medical management are not determined by genetic test results alone. Overall cancer risk assessment incorporates additional factors, including personal medical history, family history, and any available genetic information that may result in a personalized   plan for cancer prevention and surveillance.  RECOMMENDATIONS FOR FAMILY MEMBERS:  Relatives in this family might be at some increased risk of developing cancer, over the general population risk, simply due to the family  history of cancer.  We recommended male relatives in this family have a yearly mammogram beginning at age 37, or 23 years younger than the earliest onset of cancer, an annual clinical breast exam, and perform monthly breast self-exams. Male relatives in this family should also have a gynecological exam as recommended by their primary provider.  All family members should be referred for colonoscopy starting at age 68.    Based on Mr. Dralle family history, we recommended other relatives have genetic counseling and testing for the PMS2 mutation as we do not know which side of the family it is coming from. Mr. Dise will let us know if we can be of any assistance in coordinating genetic counseling and/or testing for these family members.  FOLLOW-UP: Lastly, we discussed with Mr. Hanway that cancer genetics is a rapidly advancing field and it is possible that new genetic tests will be appropriate for him and/or his family members in the future. We encouraged him to remain in contact with cancer genetics on an annual basis so we can update his personal and family histories and let him know of advances in cancer genetics that may benefit this family.   Our contact number was provided. Mr. Ku questions were answered to his satisfaction, and he knows he is welcome to call us at anytime with additional questions or concerns.   Faith Rogue, MS, [facility] Genetic Counselor [email or web address] Phone: [phone]

## 2021-01-21 ENCOUNTER — Ambulatory Visit: Payer: Self-pay | Admitting: Dermatology

## 2021-02-16 ENCOUNTER — Other Ambulatory Visit: Payer: Self-pay | Admitting: General Surgery

## 2021-02-16 NOTE — Progress Notes (Signed)
Subjective:     Patient ID: Keith York is a 50 y.o. male.  HPI  The following portions of the patient's history were reviewed and updated as appropriate.  This an established patient is here today for: office visit. The patient is here today for evaluation of a hernia.  The patient was seen in May 2017 with a epigastric hernia.  This was nonreducible.  He was originally scheduled for surgery and then deferred.  Since that time he is noted a gradual increase in size.  Rare pain, only discomfort.  His brother was recently identified with Lynch syndrome.  This was picked up within his first colonoscopy he had 12-14 polyps on a follow-up exam shortly thereafter showed an additional 10 polyps.  The patient himself is yet to have a colonoscopy.  His general health is good.  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  He is accompanied today by his wife Keith York.  She had significant health issues over the last 2 years but is now made a complete recovery.     Chief Complaint  Patient presents with  . New Patient  . Hernia     BP 132/84   Pulse 87   Temp 36.7 C (98 F)   Ht 180.3 cm (5\' 11" )   Wt (!) 104.6 kg (230 lb 8 oz)   BMI 32.15 kg/m       Past Medical History:  Diagnosis Date  . Sinus congestion           Past Surgical History:  Procedure Laterality Date  . wisdom tooth extraction  1995       Social History          Socioeconomic History  . Marital status: Married    Spouse name: Not on file  . Number of children: Not on file  . Years of education: Not on file  . Highest education level: Not on file  Occupational History  . Not on file  Tobacco Use  . Smoking status: Former Smoker    Quit date: 06/16/1994    Years since quitting: 26.4  . Smokeless tobacco: Never Used  Substance and Sexual Activity  . Alcohol use: Not on file  . Drug use: Not on file  . Sexual activity: Not on file  Other Topics Concern  . Not on file   Social History Narrative  . Not on file   Social Determinants of Health   Financial Resource Strain: Not on file  Food Insecurity: Not on file  Transportation Needs: Not on file           Allergies  Allergen Reactions  . Penicillin Rash    Current Medications        Current Outpatient Medications  Medication Sig Dispense Refill  . loratadine (CLARITIN) 10 mg tablet Take 10 mg by mouth once daily    . multivitamin tablet Take 1 tablet by mouth once daily     No current facility-administered medications for this visit.           Family History  Problem Relation Age of Onset  . Rheum arthritis Father   . Dementia Father      Labs and Radiology:          Review of Systems  Constitutional: Negative for chills and fever.  Respiratory: Negative for cough.        Objective:   Physical Exam Constitutional:      Appearance: Normal appearance.  Cardiovascular:  Rate and Rhythm: Normal rate and regular rhythm.     Pulses: Normal pulses.     Heart sounds: Normal heart sounds.  Pulmonary:     Effort: Pulmonary effort is normal.     Breath sounds: Normal breath sounds.  Abdominal:     General: Abdomen is flat.     Palpations: Abdomen is soft.     Tenderness: There is no abdominal tenderness.       Comments: Near baseball size mass in the standing position that is nonreducible.  No evidence of an umbilical defect.  Neurological:     Mental Status: He is alert and oriented to person, place, and time.  Psychiatric:        Mood and Affect: Mood normal.        Behavior: Behavior normal.        Assessment:     Candidate for screening colonoscopy based on age and family history.  Candidate for elective repair of epigastric hernia.    Plan:     Hernia repair will be undertaken in summer which is his "slow season".  The possible role of prosthetic mesh depending on fascial defect side was reviewed.  Risk of infection  discussed.   Colonoscopy to be scheduled at a convenient date.  A query has been sent to the hospital genetic counselor regarding the patient's need for Lynch syndrome testing.  Entered by Ledell Noss, CMA, acting as a scribe for Dr. Hervey Ard, MD.   The documentation recorded by the scribe accurately reflects the service I personally performed and the decisions made by me.   Robert Bellow, MD FACS   Genetic testing negative for Lynch syndrome.

## 2021-04-22 ENCOUNTER — Other Ambulatory Visit: Payer: Self-pay

## 2021-04-22 ENCOUNTER — Other Ambulatory Visit
Admission: RE | Admit: 2021-04-22 | Discharge: 2021-04-22 | Disposition: A | Discharge status: Home / Self Care | Payer: Self-pay | Source: Ambulatory Visit | Attending: General Surgery | Admitting: General Surgery

## 2021-04-22 NOTE — Patient Instructions (Addendum)
Your procedure is scheduled on: Wednesday April 29, 2021. Report to Day Surgery inside [facility] 2nd floor (stop by admissions desk first before getting on elevator). To find out your arrival time please call [phone] between 1PM - 3PM on Tuesday April 28, 2021.  Remember: Instructions that are not followed completely may result in serious medical risk,  up to and including death, or upon the discretion of your surgeon and anesthesiologist your  surgery may need to be rescheduled.     _X__ 1. Do not eat food after midnight the night before your procedure.                 No chewing gum or hard candies. You may drink clear liquids up to 2 hours                 before you are scheduled to arrive for your surgery- DO not drink clear                 liquids within 2 hours of the start of your surgery.                 Clear Liquids include:  water, apple juice without pulp, clear Gatorade, G2 or                  Gatorade Zero (avoid Red/Purple/Blue), Black Coffee or Tea (Do not add                 anything to coffee or tea).  __X__2.  On the morning of surgery brush your teeth with toothpaste and water, you                may rinse your mouth with mouthwash if you wish.  Do not swallow any toothpaste of mouthwash.     _X__ 3.  No Alcohol for 24 hours before or after surgery.   _X__ 4.  Do Not Smoke or use e-cigarettes For 24 Hours Prior to Your Surgery.                 Do not use any chewable tobacco products for at least 6 hours prior to                 Surgery.  _X__  5.  Do not use any recreational drugs (marijuana, cocaine, heroin, ecstasy, MDMA or other)                For at least one week prior to your surgery.  Combination of these drugs with anesthesia                May have life threatening results.  __X__ 6.  Notify your doctor if there is any change in your medical condition      (cold, fever, infections).     Do not wear jewelry, make-up,  hairpins, clips or nail polish. Do not wear lotions, powders, or perfumes. You may wear deodorant. Do not shave 48 hours prior to surgery. Men may shave face and neck. Do not bring valuables to the hospital.    [facility] is not responsible for any belongings or valuables.  Contacts, dentures or bridgework may not be worn into surgery. Leave your suitcase in the car. After surgery it may be brought to your room. For patients admitted to the hospital, discharge time is determined by your treatment team.   Patients discharged the day of surgery will not be allowed to drive  home.   Make arrangements for someone to be with you for the first 24 hours of your Same Day Discharge.   __X__ Take these medicines the morning of surgery with A SIP OF WATER:    1. None   2.   3.   4.  5.  6.  ____ Fleet Enema (as directed)   __X__ Use antibacterial soap as directed  ____ Use Benzoyl Peroxide Gel as instructed  ____ Use inhalers on the day of surgery  ____ Stop metformin 2 days prior to surgery    ____ Take 1/2 of usual insulin dose the night before surgery. No insulin the morning          of surgery.   ____ Call your PCP, cardiologist, or Pulmonologist if taking Coumadin/Plavix/aspirin and ask when to stop before your surgery.   __X__ One Week prior to surgery- Stop Anti-inflammatories such as Ibuprofen, Aleve, Advil, Motrin, meloxicam (MOBIC), diclofenac, etodolac, ketorolac, Toradol, Daypro, piroxicam, Goody's or BC powders. OK TO USE TYLENOL IF NEEDED   __X__ Stop supplements until after surgery.    ____ Bring C-Pap to the hospital.    If you have any questions regarding your pre-procedure instructions,  Please call Pre-admit Testing at [phone].

## 2021-04-29 ENCOUNTER — Ambulatory Visit
Admission: RE | Admit: 2021-04-29 | Discharge: 2021-04-29 | Disposition: A | Discharge status: Home / Self Care | Payer: Self-pay | Attending: General Surgery | Admitting: General Surgery

## 2021-04-29 ENCOUNTER — Encounter: Payer: Self-pay | Admitting: General Surgery

## 2021-04-29 ENCOUNTER — Encounter
Admission: RE | Disposition: A | Discharge status: Home / Self Care | Payer: Self-pay | Source: Home / Self Care | Attending: General Surgery

## 2021-04-29 ENCOUNTER — Ambulatory Visit: Payer: Self-pay | Admitting: Certified Registered"

## 2021-04-29 ENCOUNTER — Other Ambulatory Visit: Payer: Self-pay

## 2021-04-29 DIAGNOSIS — Z87891 Personal history of nicotine dependence: Secondary | ICD-10-CM | POA: Insufficient documentation

## 2021-04-29 DIAGNOSIS — Z79899 Other long term (current) drug therapy: Secondary | ICD-10-CM | POA: Insufficient documentation

## 2021-04-29 DIAGNOSIS — Z880 Allergy status to penicillin: Secondary | ICD-10-CM | POA: Insufficient documentation

## 2021-04-29 DIAGNOSIS — K436 Other and unspecified ventral hernia with obstruction, without gangrene: Secondary | ICD-10-CM | POA: Insufficient documentation

## 2021-04-29 HISTORY — PX: EPIGASTRIC HERNIA REPAIR: SHX404

## 2021-04-29 SURGERY — REPAIR, HERNIA, EPIGASTRIC, ADULT
Anesthesia: General

## 2021-04-29 MED ORDER — ORAL CARE MOUTH RINSE: 15.0000 mL | Freq: Once | OROMUCOSAL | Status: AC

## 2021-04-29 MED ORDER — FENTANYL CITRATE (PF) 100 MCG/2ML IJ SOLN
INTRAMUSCULAR | Status: AC
  Filled 2021-04-29: qty 2

## 2021-04-29 MED ORDER — FENTANYL CITRATE (PF) 100 MCG/2ML IJ SOLN
INTRAMUSCULAR | Status: DC | PRN
  Administered 2021-04-29 (×2): 50 ug via INTRAVENOUS
  Administered 2021-04-29: 25 ug via INTRAVENOUS
  Administered 2021-04-29: 50 ug via INTRAVENOUS
  Administered 2021-04-29: 25 ug via INTRAVENOUS

## 2021-04-29 MED ORDER — PROPOFOL 10 MG/ML IV BOLUS
INTRAVENOUS | Status: DC | PRN
  Administered 2021-04-29: 200 mg via INTRAVENOUS
  Administered 2021-04-29: 100 mg via INTRAVENOUS

## 2021-04-29 MED ORDER — CEFAZOLIN SODIUM-DEXTROSE 2-4 GM/100ML-% IV SOLN
INTRAVENOUS | Status: AC
  Filled 2021-04-29: qty 100

## 2021-04-29 MED ORDER — FAMOTIDINE 20 MG PO TABS: 20.0000 mg | ORAL_TABLET | Freq: Once | ORAL | Status: AC

## 2021-04-29 MED ORDER — MIDAZOLAM HCL 2 MG/2ML IJ SOLN
INTRAMUSCULAR | Status: DC | PRN
  Administered 2021-04-29: 2 mg via INTRAVENOUS

## 2021-04-29 MED ORDER — HYDROCODONE-ACETAMINOPHEN 5-325 MG PO TABS: 1.0000 | ORAL_TABLET | ORAL | 0 refills | Status: AC | PRN

## 2021-04-29 MED ORDER — OXYCODONE HCL 5 MG/5ML PO SOLN: 5.0000 mg | Freq: Once | ORAL | Status: AC | PRN

## 2021-04-29 MED ORDER — FAMOTIDINE 20 MG PO TABS
ORAL_TABLET | ORAL | Status: AC
  Administered 2021-04-29: 20 mg via ORAL
  Filled 2021-04-29: qty 1

## 2021-04-29 MED ORDER — CHLORHEXIDINE GLUCONATE CLOTH 2 % EX PADS: 6.0000 | MEDICATED_PAD | Freq: Once | CUTANEOUS | Status: DC

## 2021-04-29 MED ORDER — OXYCODONE HCL 5 MG PO TABS
ORAL_TABLET | ORAL | Status: AC
  Filled 2021-04-29: qty 1

## 2021-04-29 MED ORDER — ONDANSETRON HCL 4 MG/2ML IJ SOLN
INTRAMUSCULAR | Status: AC
  Filled 2021-04-29: qty 2

## 2021-04-29 MED ORDER — CHLORHEXIDINE GLUCONATE 0.12 % MT SOLN: 15.0000 mL | Freq: Once | OROMUCOSAL | Status: AC

## 2021-04-29 MED ORDER — FENTANYL CITRATE (PF) 100 MCG/2ML IJ SOLN
25.0000 ug | INTRAMUSCULAR | Status: DC | PRN
  Administered 2021-04-29: 50 ug via INTRAVENOUS
  Administered 2021-04-29: 25 ug via INTRAVENOUS
  Administered 2021-04-29: 50 ug via INTRAVENOUS
  Administered 2021-04-29: 25 ug via INTRAVENOUS

## 2021-04-29 MED ORDER — ACETAMINOPHEN 10 MG/ML IV SOLN
INTRAVENOUS | Status: AC
  Filled 2021-04-29: qty 100

## 2021-04-29 MED ORDER — CHLORHEXIDINE GLUCONATE 0.12 % MT SOLN
OROMUCOSAL | Status: AC
  Administered 2021-04-29: 15 mL via OROMUCOSAL
  Filled 2021-04-29: qty 15

## 2021-04-29 MED ORDER — BUPIVACAINE HCL (PF) 0.5 % IJ SOLN
INTRAMUSCULAR | Status: DC | PRN
  Administered 2021-04-29: 30 mL

## 2021-04-29 MED ORDER — CEFAZOLIN SODIUM-DEXTROSE 2-4 GM/100ML-% IV SOLN
2.0000 g | INTRAVENOUS | Status: AC
  Administered 2021-04-29: 2 g via INTRAVENOUS

## 2021-04-29 MED ORDER — OXYCODONE HCL 5 MG PO TABS
5.0000 mg | ORAL_TABLET | Freq: Once | ORAL | Status: AC | PRN
  Administered 2021-04-29: 5 mg via ORAL

## 2021-04-29 MED ORDER — LACTATED RINGERS IV SOLN: INTRAVENOUS | Status: DC

## 2021-04-29 MED ORDER — ONDANSETRON HCL 4 MG/2ML IJ SOLN
INTRAMUSCULAR | Status: DC | PRN
  Administered 2021-04-29: 4 mg via INTRAVENOUS

## 2021-04-29 MED ORDER — KETOROLAC TROMETHAMINE 30 MG/ML IJ SOLN
INTRAMUSCULAR | Status: DC | PRN
  Administered 2021-04-29: 30 mg via INTRAVENOUS

## 2021-04-29 MED ORDER — MIDAZOLAM HCL 2 MG/2ML IJ SOLN
INTRAMUSCULAR | Status: AC
  Filled 2021-04-29: qty 2

## 2021-04-29 MED ORDER — ACETAMINOPHEN 10 MG/ML IV SOLN
INTRAVENOUS | Status: DC | PRN
  Administered 2021-04-29: 1000 mg via INTRAVENOUS

## 2021-04-29 MED ORDER — LIDOCAINE HCL (CARDIAC) PF 100 MG/5ML IV SOSY
PREFILLED_SYRINGE | INTRAVENOUS | Status: DC | PRN
  Administered 2021-04-29: 60 mg via INTRAVENOUS

## 2021-04-29 SURGICAL SUPPLY — 36 items
APL PRP STRL LF DISP 70% ISPRP (MISCELLANEOUS) ×1
BLADE SURG 15 STRL SS SAFETY (BLADE) ×2 IMPLANT
CANISTER SUCT 1200ML W/VALVE (MISCELLANEOUS) ×2 IMPLANT
CHLORAPREP W/TINT 26 (MISCELLANEOUS) ×2 IMPLANT
DRAPE LAPAROTOMY 100X77 ABD (DRAPES) ×2 IMPLANT
DRSG TEGADERM 4X4.75 (GAUZE/BANDAGES/DRESSINGS) ×2 IMPLANT
DRSG TELFA 4X3 1S NADH ST (GAUZE/BANDAGES/DRESSINGS) ×2 IMPLANT
ELECT REM PT RETURN 9FT ADLT (ELECTROSURGICAL) ×2
ELECTRODE REM PT RTRN 9FT ADLT (ELECTROSURGICAL) ×1 IMPLANT
GAUZE 4X4 16PLY ~~LOC~~+RFID DBL (SPONGE) ×2 IMPLANT
GLOVE SURG ENC MOIS LTX SZ7.5 (GLOVE) ×2 IMPLANT
GLOVE SURG UNDER LTX SZ8 (GLOVE) ×2 IMPLANT
GOWN STRL REUS W/ TWL LRG LVL3 (GOWN DISPOSABLE) ×2 IMPLANT
GOWN STRL REUS W/TWL LRG LVL3 (GOWN DISPOSABLE) ×4
LABEL OR SOLS (LABEL) ×2 IMPLANT
MANIFOLD NEPTUNE II (INSTRUMENTS) ×2 IMPLANT
MESH VENTRALEX ST 2.5 CRC MED (Mesh General) ×2 IMPLANT
NEEDLE HYPO 22GX1.5 SAFETY (NEEDLE) ×4 IMPLANT
NEEDLE HYPO 25X1 1.5 SAFETY (NEEDLE) ×2 IMPLANT
NS IRRIG 500ML POUR BTL (IV SOLUTION) ×2 IMPLANT
PACK BASIN MINOR ARMC (MISCELLANEOUS) ×2 IMPLANT
SPONGE T-LAP 18X18 ~~LOC~~+RFID (SPONGE) ×2 IMPLANT
STAPLER SKIN PROX 35W (STAPLE) ×2 IMPLANT
STRIP CLOSURE SKIN 1/2X4 (GAUZE/BANDAGES/DRESSINGS) ×2 IMPLANT
SUT SURGILON 0 BLK (SUTURE) ×2 IMPLANT
SUT VIC AB 2-0 BRD 54 (SUTURE) ×2 IMPLANT
SUT VIC AB 2-0 CT1 (SUTURE) ×2 IMPLANT
SUT VIC AB 2-0 CT1 27 (SUTURE) ×2
SUT VIC AB 2-0 CT1 TAPERPNT 27 (SUTURE) ×1 IMPLANT
SUT VIC AB 3-0 SH 27 (SUTURE) ×2
SUT VIC AB 3-0 SH 27X BRD (SUTURE) ×1 IMPLANT
SUT VIC AB 4-0 FS2 27 (SUTURE) ×2 IMPLANT
SUT VICRYL+ 3-0 144IN (SUTURE) ×2 IMPLANT
SWABSTK COMLB BENZOIN TINCTURE (MISCELLANEOUS) ×2 IMPLANT
SYR 3ML LL SCALE MARK (SYRINGE) ×2 IMPLANT
SYR CONTROL 10ML LL (SYRINGE) ×2 IMPLANT

## 2021-04-29 NOTE — Transfer of Care (Signed)
Immediate Anesthesia Transfer of Care Note  Patient: Keith York  Procedure(s) Performed: HERNIA REPAIR EPIGASTRIC ADULT  Patient Location: PACU  Anesthesia Type:General  Level of Consciousness: drowsy  Airway & Oxygen Therapy: Patient Spontanous Breathing and Patient connected to face mask oxygen  Post-op Assessment: Report given to RN  Post vital signs: stable  Last Vitals:  Vitals Value Taken Time  BP    Temp    Pulse 64 04/29/21 1540  Resp 14 04/29/21 1540  SpO2 98 % 04/29/21 1540  Vitals shown include unvalidated device data.  Last Pain:  Vitals:   04/29/21 1321  TempSrc: Temporal  PainSc: 0-No pain         Complications: No notable events documented.

## 2021-04-29 NOTE — Anesthesia Preprocedure Evaluation (Addendum)
Anesthesia Evaluation  Patient identified by MRN, date of birth, ID band Patient awake    Reviewed: Allergy & Precautions, NPO status , Patient's Chart, lab work & pertinent test results  History of Anesthesia Complications Negative for: history of anesthetic complications  Airway Mallampati: III  TM Distance: >3 FB Neck ROM: full    Dental  (+) Chipped   Pulmonary neg shortness of breath, former smoker,    Pulmonary exam normal        Cardiovascular Exercise Tolerance: Good (-) angina(-) Past MI and (-) DOE negative cardio ROS Normal cardiovascular exam     Neuro/Psych negative neurological ROS  negative psych ROS   GI/Hepatic negative GI ROS, Neg liver ROS, neg GERD  ,  Endo/Other  negative endocrine ROS  Renal/GU      Musculoskeletal   Abdominal   Peds  Hematology negative hematology ROS (+)   Anesthesia Other Findings Past Medical History: No date: Allergy No date: Family history of colonic polyps No date: Family history of Lynch syndrome No date: Sinus congestion  Past Surgical History: 1995: WISDOM TOOTH EXTRACTION  BMI    Body Mass Index: 31.39 kg/m      Reproductive/Obstetrics negative OB ROS                             Anesthesia Physical Anesthesia Plan  ASA: 2  Anesthesia Plan: General LMA   Post-op Pain Management:    Induction: Intravenous  PONV Risk Score and Plan: Ondansetron, Dexamethasone, Midazolam and Treatment may vary due to age or medical condition  Airway Management Planned: LMA  Additional Equipment:   Intra-op Plan:   Post-operative Plan: Extubation in OR  Informed Consent: I have reviewed the patients History and Physical, chart, labs and discussed the procedure including the risks, benefits and alternatives for the proposed anesthesia with the patient or authorized representative who has indicated his/her understanding and acceptance.      Dental Advisory Given  Plan Discussed with: Anesthesiologist, CRNA and Surgeon  Anesthesia Plan Comments: (Patient consented for risks of anesthesia including but not limited to:  - adverse reactions to medications - damage to eyes, teeth, lips or other oral mucosa - nerve damage due to positioning  - sore throat or hoarseness - Damage to heart, brain, nerves, lungs, other parts of body or loss of life  Patient voiced understanding.)       Anesthesia Quick Evaluation

## 2021-04-29 NOTE — Discharge Instructions (Addendum)
AMBULATORY SURGERY  ?DISCHARGE INSTRUCTIONS ? ? ?The drugs that you were given will stay in your system until tomorrow so for the next 24 hours you should not: ? ?Drive an automobile ?Make any legal decisions ?Drink any alcoholic beverage ? ? ?You may resume regular meals tomorrow.  Today it is better to start with liquids and gradually work up to solid foods. ? ?You may eat anything you prefer, but it is better to start with liquids, then soup and crackers, and gradually work up to solid foods. ? ? ?Please notify your doctor immediately if you have any unusual bleeding, trouble breathing, redness and pain at the surgery site, drainage, fever, or pain not relieved by medication. ? ? ? ?Additional Instructions: ? ? ? ?Please contact your physician with any problems or Same Day Surgery at [phone], Monday through Friday 6 am to 4 pm, or [place] [facility] Main number at [phone].  ?

## 2021-04-29 NOTE — Anesthesia Procedure Notes (Signed)
Procedure Name: LMA Insertion Date/Time: 04/29/2021 2:41 PM Performed by: Biagio Borg, CRNA Pre-anesthesia Checklist: Patient identified, Emergency Drugs available, Suction available and Patient being monitored Patient Re-evaluated:Patient Re-evaluated prior to induction Oxygen Delivery Method: Circle system utilized Preoxygenation: Pre-oxygenation with 100% oxygen Induction Type: IV induction Ventilation: Mask ventilation without difficulty LMA Size: 4.5 Tube type: Oral Number of attempts: 1 Placement Confirmation: ETT inserted through vocal cords under direct vision, positive ETCO2 and breath sounds checked- equal and bilateral Tube secured with: Tape Dental Injury: Teeth and Oropharynx as per pre-operative assessment

## 2021-04-29 NOTE — Op Note (Signed)
Preoperative diagnosis: Epigastric hernia with incarcerated omentum.  Postoperative diagnosis: Same.  Operative procedure: Repair of epigastric hernia with partial omentectomy, placement of 6.4 cm Ventralex mesh.  Operating surgeon: Hervey Ard, MD.  Anesthesia: General by LMA, Marcaine 0.5%, plain: 30 cc.  Estimated blood loss: 5 cc.  Clinical note: This 50 year old male said a longstanding epigastric hernia that has continued to enlarge.  He is admitted for elective repair.  SCD stockings for DVT prevention.  He received Ancef prior to anesthesia.  Hair was removed and the surgical site prior to presentation to the operating theater.  Operative note: With the patient under adequate general anesthesia the abdomen was cleansed with ChloraPrep and draped.  A vertical incision was made for about 6 cm above the level of the umbilicus.  The skin was incised sharply and the remaining dissection completed with electrocautery.  The hernia sac was identified and freed circumferentially to the fascial level.  This was then opened and the sac excised and discarded.  The incarcerated omentum was not able to be reduced through the 3 cm fascial defect and this was partially amputated.  Hemostasis was with 2-0 Vicryl ties.  After returning the remaining omentum and the abdominal cavity palpation through the fascial defect showed no other areas of weakness.  In particular, no evidence of an umbilical defect.  There was a 2 cm ball of adipose tissue on the left lateral side this was opened and discarded.  This was coming through a pinpoint opening just lateral to the main incision.  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  This was placed without incident.  The mesh was then incorporated in the transverse fascial closure with interrupted 0 Surgilon sutures.  The adipose layer was closed with 2-0 Vicryl figure-of-eight sutures in 2 layers.   The skin was closed with a running 4-0 Vicryl subcuticular suture.  Benzoin, Steri-Strips, Telfa and Tegaderm dressing was applied.  The patient tolerated procedure well and was taken to recovery in stable condition.

## 2021-04-29 NOTE — H&P (Signed)
DOLORES MCGOVERN [id number] Dec 31, 1970     HPI:  50 y/o male with long standing epigastric hernia. For repair.   Medications Prior to Admission  Medication Sig Dispense Refill Last Dose   Cyanocobalamin (B-12 PO) Take 1 tablet by mouth daily.   04/23/2021   ibuprofen (ADVIL,MOTRIN) 200 MG tablet Take 400 mg by mouth every 6 (six) hours as needed for pain.   04/23/2021   loratadine (CLARITIN) 10 MG tablet Take 10 mg by mouth daily.   04/23/2021   Magnesium Hydroxide (MAGNESIA PO) Take 1 tablet by mouth daily.   04/23/2021   montelukast (SINGULAIR) 10 MG tablet Take 10 mg by mouth daily.   04/23/2021   Allergies  Allergen Reactions   Penicillins Rash   Past Medical History:  Diagnosis Date   Allergy    Family history of colonic polyps    Family history of Lynch syndrome    Sinus congestion    Past Surgical History:  Procedure Laterality Date   WISDOM TOOTH EXTRACTION  1995   Social History   Socioeconomic History   Marital status: Married    Spouse name: Not on file   Number of children: Not on file   Years of education: Not on file   Highest education level: Not on file  Occupational History   Not on file  Tobacco Use   Smoking status: Former    Pack years: 0.00    Types: Cigarettes    Quit date: 06/16/1994    Years since quitting: 26.8   Smokeless tobacco: Never  Substance and Sexual Activity   Alcohol use: No    Alcohol/week: 0.0 standard drinks   Drug use: No   Sexual activity: Not on file  Other Topics Concern   Not on file  Social History Narrative   Not on file   Social Determinants of Health   Financial Resource Strain: Not on file  Food Insecurity: Not on file  Transportation Needs: Not on file  Physical Activity: Not on file  Stress: Not on file  Social Connections: Not on file  Intimate Partner Violence: Not on file   Social History   Social History Narrative   Not on file     ROS: Negative.     PE: HEENT: Negative. Lungs: Clear. Cardio:  RR. ABD: Epigastric hernia  Assessment/Plan:  Proceed with planned  epigastric hernia repair.  Forest Gleason [facility] 04/29/2021

## 2021-04-30 ENCOUNTER — Encounter: Payer: Self-pay | Admitting: General Surgery

## 2021-04-30 NOTE — Anesthesia Postprocedure Evaluation (Signed)
Anesthesia Post Note  Patient: Keith York  Procedure(s) Performed: HERNIA REPAIR EPIGASTRIC ADULT  Patient location during evaluation: PACU Anesthesia Type: General Level of consciousness: awake and alert Pain management: pain level controlled Vital Signs Assessment: post-procedure vital signs reviewed and stable Respiratory status: spontaneous breathing, nonlabored ventilation, respiratory function stable and patient connected to nasal cannula oxygen Cardiovascular status: blood pressure returned to baseline and stable Postop Assessment: no apparent nausea or vomiting Anesthetic complications: no   No notable events documented.   Last Vitals:  Vitals:   04/29/21 1630 04/29/21 1649  BP: (!) 148/95 (!) 146/86  Pulse: 73 63  Resp: 15 15  Temp: (!) 36.2 C (!) 36.2 C  SpO2: 98% 98%    Last Pain:  Vitals:   04/29/21 1649  TempSrc: Temporal  PainSc: 5                  [name]

## 2021-05-21 ENCOUNTER — Encounter: Payer: Self-pay | Admitting: Dermatology

## 2021-05-21 ENCOUNTER — Other Ambulatory Visit: Payer: Self-pay

## 2021-05-21 ENCOUNTER — Ambulatory Visit (INDEPENDENT_AMBULATORY_CARE_PROVIDER_SITE_OTHER): Payer: Self-pay | Admitting: Dermatology

## 2021-05-21 DIAGNOSIS — H61002 Unspecified perichondritis of left external ear: Secondary | ICD-10-CM

## 2021-05-21 DIAGNOSIS — D485 Neoplasm of uncertain behavior of skin: Secondary | ICD-10-CM

## 2021-05-21 DIAGNOSIS — L821 Other seborrheic keratosis: Secondary | ICD-10-CM

## 2021-05-21 DIAGNOSIS — L720 Epidermal cyst: Secondary | ICD-10-CM

## 2021-05-21 DIAGNOSIS — D2239 Melanocytic nevi of other parts of face: Secondary | ICD-10-CM

## 2021-05-21 NOTE — Patient Instructions (Addendum)
If you have any questions or concerns for your doctor, please call our main line at [phone] and press option 4 to reach your doctor's medical assistant. If no one answers, please leave a voicemail as directed and we will return your call as soon as possible. Messages left after 4 pm will be answered the following business day.   You may also send us a message via MyChart. We typically respond to MyChart messages within 1-2 business days.  For prescription refills, please ask your pharmacy to contact our office. Our fax number is [phone].  If you have an urgent issue when the clinic is closed that cannot wait until the next business day, you can page your doctor at the number below.    Please note that while we do our best to be available for urgent issues outside of office hours, we are not available 24/7.   If you have an urgent issue and are unable to reach us, you may choose to seek medical care at your doctor's office, retail clinic, urgent care center, or emergency room.  If you have a medical emergency, please immediately call 911 or go to the emergency department.  Pager Numbers  - Dr. Kowalski: [phone]  - Dr. Moye: [phone]  - Dr. Stewart: [phone]  In the event of inclement weather, please call our main line at [phone] for an update on the status of any delays or closures.  Dermatology Medication Tips: Please keep the boxes that topical medications come in in order to help keep track of the instructions about where and how to use these. Pharmacies typically print the medication instructions only on the boxes and not directly on the medication tubes.   If your medication is too expensive, please contact our office at [phone] option 4 or send us a message through MyChart.   We are unable to tell what your co-pay for medications will be in advance as this is different depending on your insurance coverage. However, we may be able to find a substitute  medication at lower cost or fill out paperwork to get insurance to cover a needed medication.   If a prior authorization is required to get your medication covered by your insurance company, please allow us 1-2 business days to complete this process.  Drug prices often vary depending on where the prescription is filled and some pharmacies may offer cheaper prices.  The website www.goodrx.com contains coupons for medications through different pharmacies. The prices here do not account for what the cost may be with help from insurance (it may be cheaper with your insurance), but the website can give you the price if you did not use any insurance.  - You can print the associated coupon and take it with your prescription to the pharmacy.  - You may also stop by our office during regular business hours and pick up a GoodRx coupon card.  - If you need your prescription sent electronically to a different pharmacy, notify our office through [place] MyChart or by phone at [phone] option 4.   Wound Care Instructions  Cleanse wound gently with soap and water once a day then pat dry with clean gauze. Apply a thing coat of Petrolatum (petroleum jelly, "Vaseline") over the wound (unless you have an allergy to this). We recommend that you use a new, sterile tube of Vaseline. Do not pick or remove scabs. Do not remove the yellow or white "healing tissue" from the base of the wound.  Cover the   wound with fresh, clean, nonstick gauze and secure with paper tape. You may use Band-Aids in place of gauze and tape if the would is small enough, but would recommend trimming much of the tape off as there is often too much. Sometimes Band-Aids can irritate the skin.  You should call the office for your biopsy report after 1 week if you have not already been contacted.  If you experience any problems, such as abnormal amounts of bleeding, swelling, significant bruising, significant pain, or evidence of infection,  please call the office immediately.  FOR ADULT SURGERY PATIENTS: If you need something for pain relief you may take 1 extra strength Tylenol (acetaminophen) AND 2 Ibuprofen (200mg each) together every 4 hours as needed for pain. (do not take these if you are allergic to them or if you have a reason you should not take them.) Typically, you may only need pain medication for 1 to 3 days.    

## 2021-05-21 NOTE — Progress Notes (Signed)
   Follow-Up Visit   Subjective  Keith York is a 50 y.o. male who presents for the following: Lesion (On the chest - patient thinks it may be a cyst as it has ruptured in the past. He would like to discuss removal.) and skin lesions (On the face and L calf - patient would like them checked to ensure benign.).  The following portions of the chart were reviewed this encounter and updated as appropriate:   Tobacco  Allergies  Meds  Problems  Med Hx  Surg Hx  Fam Hx     Review of Systems:  No other skin or systemic complaints except as noted in HPI or Assessment and Plan.  Objective  Well appearing patient in no apparent distress; mood and affect are within normal limits.  A focused examination was performed including the face, trunk, and extremities. Relevant physical exam findings are noted in the Assessment and Plan.  L of midline sternum Flesh colored papule 0.6 cm.  L preauricular 0.5 cm firm SQ nodule.   Assessment & Plan  Neoplasm of uncertain behavior of skin -irritated nevus versus other rule out dysplasia L of midline sternum Skin / nail biopsy Type of biopsy: tangential   Informed consent: discussed and consent obtained   Timeout: patient name, date of birth, surgical site, and procedure verified   Procedure prep:  Patient was prepped and draped in usual sterile fashion Prep type:  Isopropyl alcohol Anesthesia: the lesion was anesthetized in a standard fashion   Anesthetic:  1% lidocaine w/ epinephrine 1-100,000 buffered w/ 8.4% NaHCO3 Instrument used: flexible razor blade   Hemostasis achieved with: pressure, aluminum chloride and electrodesiccation   Outcome: patient tolerated procedure well   Post-procedure details: sterile dressing applied and wound care instructions given   Dressing type: bandage and petrolatum    Specimen 1 - Surgical pathology Differential Diagnosis: D48.5 irritated nevus r/o dysplasia Check Margins: No  Epidermal inclusion cyst L  preauricular Benign-appearing. Exam most consistent with an epidermal inclusion cyst. Discussed that a cyst is a benign growth that can grow over time and sometimes get irritated or inflamed. Recommend observation if it is not bothersome. Discussed option of surgical excision to remove it if it is growing, symptomatic, or other changes noted. Please call for new or changing lesions so they can be evaluated.  Chondrodermatitis nodularis helicis of left ear -currently calm L ear anti helix Benign-appearing.  Observation.  Call clinic for new or changing lesions.  Recommend daily use of broad spectrum spf 30+ sunscreen to sun-exposed areas.   Seborrheic Keratoses - on the L lower leg and left arm - Stuck-on, waxy, tan-brown papules and/or plaques  - Benign-appearing - Discussed benign etiology and prognosis. - Observe - Call for any changes  Melanocytic Nevi - R cheek  - Tan-brown and/or pink-flesh-colored symmetric macules and papules - Benign appearing on exam today - Observation - Call clinic for new or changing moles - Recommend daily use of broad spectrum spf 30+ sunscreen to sun-exposed areas.   Return if symptoms worsen or fail to improve.  Luther Redo, CMA, am acting as scribe for Sarina Ser, MD . Documentation: I have reviewed the above documentation for accuracy and completeness, and I agree with the above.  Sarina Ser, MD

## 2021-06-03 ENCOUNTER — Telehealth: Payer: Self-pay

## 2021-06-03 NOTE — Telephone Encounter (Signed)
-----   Message from Ralene Bathe, MD sent at 06/01/2021  5:41 PM EDT ----- Diagnosis Skin , left of midline sternum NEUROFIBROMA, BASE INVOLVED  Benign neurofibroma No further treatment needed Recheck next visit

## 2021-06-03 NOTE — Telephone Encounter (Signed)
Left message on voicemail to return my call.  

## 2021-06-08 ENCOUNTER — Telehealth: Payer: Self-pay

## 2021-06-08 NOTE — Telephone Encounter (Signed)
-----   Message from Ralene Bathe, MD sent at 06/01/2021  5:41 PM EDT ----- Diagnosis Skin , left of midline sternum NEUROFIBROMA, BASE INVOLVED  Benign neurofibroma No further treatment needed Recheck next visit

## 2021-06-08 NOTE — Telephone Encounter (Signed)
Patient advised of BX results and also viewed on MyChart.

## 2023-07-26 ENCOUNTER — Ambulatory Visit: Payer: Self-pay | Attending: Family

## 2023-07-26 DIAGNOSIS — M25552 Pain in left hip: Secondary | ICD-10-CM | POA: Insufficient documentation

## 2023-07-26 DIAGNOSIS — M6281 Muscle weakness (generalized): Secondary | ICD-10-CM | POA: Insufficient documentation

## 2023-07-26 NOTE — Therapy (Cosign Needed Addendum)
OUTPATIENT PHYSICAL THERAPY HIP EVALUATION   Patient Name: Keith York MRN: [id number] DOB:1971/03/02, 52 y.o., male Today's Date: 07/27/2023  END OF SESSION:  PT End of Session - 07/26/23 0806     Visit Number 1    Number of Visits 17    Date for PT Re-Evaluation 09/20/23    Authorization Type Eval: 07/26/2023    PT Start Time 0815    PT Stop Time 0905    PT Time Calculation (min) 50 min    Activity Tolerance Patient tolerated treatment well    Behavior During Therapy [facility] for tasks assessed/performed            Past Medical History:  Diagnosis Date   Allergy    Family history of colonic polyps    Family history of Lynch syndrome    Sinus congestion    Past Surgical History:  Procedure Laterality Date   EPIGASTRIC HERNIA REPAIR N/A 04/29/2021   Procedure: HERNIA REPAIR EPIGASTRIC ADULT;  Surgeon: Earline Mayotte, MD;  Location: [facility] ORS;  Service: General;  Laterality: N/A;   WISDOM TOOTH EXTRACTION  1995   Patient Active Problem List   Diagnosis Date Noted   Genetic testing 01/01/2021   Family history of colonic polyps    Family history of Lynch syndrome    Epigastric hernia 02/18/2016    PCP: Patient, No Pcp Per  REFERRING PROVIDER: Ronne Binning, NP  REFERRING DIAG: [phone] (ICD-10-CM) - Pain in left hip   RATIONALE FOR EVALUATION AND TREATMENT: Rehabilitation  THERAPY DIAG: Muscle weakness (generalized)  Pain in left hip  ONSET DATE: June 1st 2024  FOLLOW-UP APPT SCHEDULED WITH REFERRING PROVIDER: Yes    SUBJECTIVE:                                                                                                                                                                                         SUBJECTIVE STATEMENT:  Left Hip Pain   PERTINENT HISTORY:   07/26/2023:  Patient reports to PT with left hip pain following a fall in June. He reports "he turned a corner, fell, and landed on his hip." Since the fall patient reports intermittent  pain from posterolateral hip towards to the foot. Patient reports aggravation in pain when riding in smaller cars. He reports loss of sleep due to pain and is unable to rest a total of > 5 hours due to onset of pain throughout the night. Minor relief for two weeks after Prednisone taper however pain returned. Patient reports monthly "spinal adjustments" with chiropractor with minimal relief of hip pain. Currently he denies saddle paraesthesia, fevers, chills, or loss of  b/b.   PAIN:    Pain Intensity: Present: 0/10, Best: 0/10, Worst: 5/10 Pain location: Lateral left hip  Pain Quality: intermittent, sharp, and aching  Radiating: Yes, down LLE Numbness/Tingling: Yes Focal Weakness: No Aggravating factors: Prolonged sitting or supine positions Relieving factors: Walking, "Moving around"   24-hour pain behavior: AM: No Pain noted, PM: Pain throughout sleep  History of prior back or hip injury, pain, surgery, or therapy: Yes, history of back pain; Dominant hand: right Imaging: Yes, pt reports normal plain film radiographs; Red flags: Negative for bowel/bladder changes, saddle paresthesia, testicular pain, h/o kidney stones, h/o spinal tumors, h/o compression fx, personal history of cancer/malignancy (especially prostate or pelvic), acute hip trauma, night pain, h/o abdominal aneurysm, abdominal pain, chills/fever, night sweats, nausea, vomiting, diarrhea, unexplained weight gain/loss;  PRECAUTIONS: None  WEIGHT BEARING RESTRICTIONS: No  FALLS: Has patient fallen in last 6 months? Yes. 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     OBJECTIVE:  Patient Surveys  FOTO: 61, predicted 81 HAGOS: HAGOS Score: 77.7%   Cognition Patient is oriented to person, place, and time.  Recent memory is intact.  Remote memory is intact.  Attention span and concentration are intact.  Expressive speech is intact.  Patient's fund of knowledge is within normal limits for educational level.    Gross Musculoskeletal Assessment Tremor: None; Bulk: Normal, no muscle wasting noted; Tone: Notable increased tension in left lumbar paraspinals ; No visible step-off along spinal column, no signs of scoliosis, no gross anatomical hip deformities; No erythema, edema, or ecchymosis noted;  GAIT: (Deferred)  Posture: Lumbar lordosis: WNL Iliac crest height: Equal bilaterally Lumbar lateral shift: Negative Leg length: Equal bilaterally;  AROM AROM (Normal range in degrees) AROM   Lumbar   Flexion (65) WNL   Extension (30) WNL  Right lateral flexion (25)   Left lateral flexion (25)   Right rotation (30)   Left rotation (30)       Hip Right Left  Flexion (125) WNL WNL  Extension (15)    Abduction (40)    Adduction (30)    Internal Rotation (45) WNL WNL  External Rotation (45) WNL WNL      Knee    Flexion (135) WNL WNL  Extension (0) WNL WNL      Ankle    Dorsiflexion (20) WNL WNL  Plantarflexion (50) WNL WNL  (* = pain; Blank rows = not tested)  LE MMT: MMT (out of 5) Right  Left   Hip flexion 4 4  Hip extension    Hip abduction 4 4  Hip adduction    Hip internal rotation 4+ 4+  Hip external rotation 4+ 4+  Knee flexion 5 5  Knee extension 5 5  Ankle dorsiflexion 5 5  Ankle plantarflexion 5 5  Ankle inversion    Ankle eversion    (* = pain; Blank rows = not tested)  Sensation Deferred  Reflexes Deferred   Muscle Length Hamstrings: R: Negative L: Positive for tightness and concordant pain (30 knee flexion)  [place] (quadriceps): R: Not done L: Not done Thomas (hip flexors): R: Not done L: Not  done [employer]: R: Not done L: Not done  Palpation Location Right Left         Lumbar paraspinals 0 1  Quadratus Lumborum 0 0  Iliac Crest    ASIS 0 0  Pubic Rami    Pubic symphysis   Femoral Triangle    Inguinal ligament    Gluteus Maximus 0 0  Gluteus Medius 0 1  Deep hip external rotators 0 2  Sacrum   PSIS    Fortin's Area (SIJ) 0 0  Coccyx   Ischial Tuberosity 0 0  Greater Trochanter 0 0  (Blank rows = not tested) Graded on 0-4 scale (0 = no pain, 1 = pain, 2 = pain with wincing/grimacing/flinching, 3 = pain with withdrawal, 4 = unwilling to allow palpation)  Passive Accessory Intervertebral Motion Pt denies reproduction of hip pain with CPA L1-L5. UPA at L4 segment with report of concordant pain with radicular symptoms down LLE. Reduction of pain with repeated prone press up.   Special Tests Lumbar Radiculopathy and Discogenic: Centralization and Peripheralization (SN 92, -LR 0.12): Not done Slump (SN 83, -LR 0.32): R: Not done L: Not done SLR (SN 92, -LR 0.29): R: Negative L:  Negative Crossed SLR (SP 90): R: Negative L: Negative  Facet Joint: Extension-Rotation (SN 100, -LR 0.0): R: Not done L: Not done  Lumbar Foraminal Stenosis: Lumbar quadrant (SN 70): R: Not done L: Not done  Hip: FABER (SN 81): R: Negative L: Negative FADIR (SN 94): R: Reported concordant pain L: Negative Hip scour (SN 50): R: Negative L: Negative  SIJ:  Thigh Thrust (SN 88, -LR 0.18) : R: Negative L: Negative  Piriformis Syndrome: FAIR Test (SN 88, SP 83): R: Not done L: Not done  Physical Performance Measures (Deferred):   TODAY'S TREATMENT:  Trigger Point Dry Needling (TDN), unbilled Education performed with patient regarding potential benefit of TDN. Reviewed precautions and risks with patient. Extensive time spent with pt to ensure full understanding of TDN risks. Pt provided verbal consent to treatment. With pt in prone using clean technique TDN performed to L lumbar multifidi  at L4 and piriformis with 2, 0.30 x 75 single needle placements (one in each location) with local twitch response (LTR). Pistoning technique utilized. Improved pain-free motion following intervention.    PATIENT EDUCATION:  Education details: Plan of Care and HEP  Person educated: Patient Education method: Explanation, Demonstration, and Handouts Education comprehension: verbalized understanding   HOME EXERCISE PROGRAM:  Access Code: [email or web address] URL: [email or web address] Date: 07/27/2023 Prepared by: Ria Comment  Exercises - Supine Piriformis Stretch  - 1 x daily - 7 x weekly - 3 sets - 10 reps - Seated Piriformis Stretch with Trunk Bend  - 1 x daily - 7 x weekly - 3 sets - 10 reps - Standing Lumbar Extension  - 6 x daily - 7 x weekly - 2 sets - 10 reps - 3s hold   ASSESSMENT:  CLINICAL IMPRESSION: Patient is a 52 y.o. male who was seen today for physical therapy evaluation and treatment for left hip pain. He is an independent individual with all functional and ADLs. 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 Currently he reports limitations with sleep and prolonged sitting due to pain in the hip. Objective findings are significant with decreased strength, increased muscle tone in lower back, and pain. He reported pain radiating down the posterolateral leg with active knee extension in  90-90 position, passive hamstring stretch and local palpation  throughout left deep gluteals and UPAs at L4-L5 segment. Pain improved with supine LE nerve glides and prone press-ups. PT explained proper LE stretches and mobility exercises in order to reduce pain. PT performed dry needling to L lumbar multifidi and L piriformis. Based on today's performance Pt will benefit from PT services 1-2x/week to address deficits in strength, balance, and mobility in order to return to full function at home.   OBJECTIVE  IMPAIRMENTS: decreased strength, increased muscle spasms, impaired sensation, and pain.   ACTIVITY LIMITATIONS: sitting and sleeping  PARTICIPATION LIMITATIONS: driving, occupation, and yard work  PERSONAL FACTORS: Age, Behavior pattern, and Profession are also affecting patient's functional outcome.   REHAB POTENTIAL: Excellent  CLINICAL DECISION MAKING: Evolving/moderate complexity  EVALUATION COMPLEXITY: Moderate   GOALS: Goals reviewed with patient? No  SHORT TERM GOALS: Target date: 08/23/2023   Pt will be independent with HEP in order to improve strength and decrease hip pain to improve pain-free function at home and work. Baseline: 07/26/2023: Goal status: INITIAL   LONG TERM GOALS: Target date: 09/20/2023  Pt will increase FOTO to at least 81 to demonstrate significant improvement in function at home and work related to hip pain.  Baseline: 07/26/2023: 73 Goal status: INITIAL  2.  Pt will decrease worst hip pain by at least 3 points on the NPRS in order to demonstrate clinically significant reduction in hip pain. Baseline: 07/26/2023: 5/10 Goal status: INITIAL  3.  Pt will increase HAGOS score by at least 18 points in order demonstrate clinically significant reduction in hip pain/disability.       Baseline: 07/26/2023: 77% Goal status: INITIAL  4. Pt will report at least 5-6 hours of full rest without pain in order to demonstrate improvement in hip pain.  Baseline: 07/26/2023: n/a  Goal status: INITIAL     PLAN: PT FREQUENCY: 1-2x/week  PT DURATION: 8 weeks  PLANNED INTERVENTIONS: Therapeutic exercises, Therapeutic activity, Neuromuscular re-education, Balance training, Gait training, Patient/Family education, Self Care, Joint mobilization, Joint manipulation, Vestibular training, Canalith repositioning, Orthotic/Fit training, DME instructions, Dry Needling, Electrical stimulation, Spinal manipulation, Spinal mobilization, Cryotherapy, Moist heat, Taping,  Traction, Ultrasound, Ionotophoresis 4mg /ml Dexamethasone, Manual therapy, and Re-evaluation.  PLAN FOR NEXT SESSION: UPA L4 Segment, Dry Needling, Progressing Strength protocol.   Keith York SPT Sharalyn Ink Huprich PT, DPT, GCS  Huprich,Jason, PT 07/27/2023, 2:23 PM

## 2023-08-02 ENCOUNTER — Ambulatory Visit: Payer: Self-pay

## 2023-08-02 DIAGNOSIS — M6281 Muscle weakness (generalized): Secondary | ICD-10-CM

## 2023-08-02 DIAGNOSIS — M25552 Pain in left hip: Secondary | ICD-10-CM

## 2023-08-02 NOTE — Therapy (Addendum)
OUTPATIENT PHYSICAL THERAPY HIP EVALUATION   Patient Name: Keith York MRN: [id number] DOB:Dec 29, 1970, 52 y.o., male Today's Date: 08/02/2023  END OF SESSION:  PT End of Session - 08/02/23 0807     Visit Number 2    Number of Visits 17    Date for PT Re-Evaluation 09/20/23    Authorization Type Eval: 07/26/2023    PT Start Time 0805    PT Stop Time 0845    PT Time Calculation (min) 40 min    Activity Tolerance Patient tolerated treatment well    Behavior During Therapy [facility] for tasks assessed/performed             Past Medical History:  Diagnosis Date   Allergy    Family history of colonic polyps    Family history of Lynch syndrome    Sinus congestion    Past Surgical History:  Procedure Laterality Date   EPIGASTRIC HERNIA REPAIR N/A 04/29/2021   Procedure: HERNIA REPAIR EPIGASTRIC ADULT;  Surgeon: Earline Mayotte, MD;  Location: [facility] ORS;  Service: General;  Laterality: N/A;   WISDOM TOOTH EXTRACTION  1995   Patient Active Problem List   Diagnosis Date Noted   Genetic testing 01/01/2021   Family history of colonic polyps    Family history of Lynch syndrome    Epigastric hernia 02/18/2016    PCP: Patient, No Pcp Per  REFERRING PROVIDER: Ronne Binning, NP  REFERRING DIAG: [phone] (ICD-10-CM) - Pain in left hip   RATIONALE FOR EVALUATION AND TREATMENT: Rehabilitation  THERAPY DIAG: Pain in left hip  Muscle weakness (generalized)  ONSET DATE: June 1st 2024  FOLLOW-UP APPT SCHEDULED WITH REFERRING PROVIDER: Yes    SUBJECTIVE:                                                                                                                                                                                         SUBJECTIVE STATEMENT:  Left Hip Pain   PERTINENT HISTORY:   07/26/2023:  Patient reports to PT with left hip pain following a fall in June. He reports "he turned a corner, fell, and landed on his hip." Since the fall patient reports  intermittent pain from posterolateral hip towards to the foot. Patient reports aggravation in pain when riding in smaller cars. He reports loss of sleep due to pain and is unable to rest a total of > 5 hours due to onset of pain throughout the night. Minor relief for two weeks after Prednisone taper however pain returned. Patient reports monthly "spinal adjustments" with chiropractor with minimal relief of hip pain. Currently he denies saddle paraesthesia, fevers, chills, or loss  of b/b.   PAIN:    Pain Intensity: Present: 0/10, Best: 0/10, Worst: 5/10 Pain location: Lateral left hip  Pain Quality: intermittent, sharp, and aching  Radiating: Yes, down LLE Numbness/Tingling: Yes Focal Weakness: No Aggravating factors: Prolonged sitting or supine positions Relieving factors: Walking, "Moving around"   24-hour pain behavior: AM: No Pain noted, PM: Pain throughout sleep  History of prior back or hip injury, pain, surgery, or therapy: Yes, history of back pain; Dominant hand: right Imaging: Yes, pt reports normal plain film radiographs; Red flags: Negative for bowel/bladder changes, saddle paresthesia, testicular pain, h/o kidney stones, h/o spinal tumors, h/o compression fx, personal history of cancer/malignancy (especially prostate or pelvic), acute hip trauma, night pain, h/o abdominal aneurysm, abdominal pain, chills/fever, night sweats, nausea, vomiting, diarrhea, unexplained weight gain/loss;  PRECAUTIONS: None  WEIGHT BEARING RESTRICTIONS: No  FALLS: Has patient fallen in last 6 months? Yes. 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     OBJECTIVE:  Patient Surveys  FOTO: 24, predicted 81 HAGOS: HAGOS Score: 77.7%   Cognition Patient is oriented to person, place, and time.  Recent memory is intact.  Remote memory is intact.  Attention span and concentration are intact.  Expressive speech is intact.  Patient's fund of knowledge is within normal limits for educational level.    Gross Musculoskeletal Assessment Tremor: None; Bulk: Normal, no muscle wasting noted; Tone: Notable increased tension in left lumbar paraspinals ; No visible step-off along spinal column, no signs of scoliosis, no gross anatomical hip deformities; No erythema, edema, or ecchymosis noted;  GAIT: (Deferred)  Posture: Lumbar lordosis: WNL Iliac crest height: Equal bilaterally Lumbar lateral shift: Negative Leg length: Equal bilaterally;  AROM AROM (Normal range in degrees) AROM   Lumbar   Flexion (65) WNL   Extension (30) WNL  Right lateral flexion (25)   Left lateral flexion (25)   Right rotation (30)   Left rotation (30)       Hip Right Left  Flexion (125) WNL WNL  Extension (15)    Abduction (40)    Adduction (30)    Internal Rotation (45) WNL WNL  External Rotation (45) WNL WNL      Knee    Flexion (135) WNL WNL  Extension (0) WNL WNL      Ankle    Dorsiflexion (20) WNL WNL  Plantarflexion (50) WNL WNL  (* = pain; Blank rows = not tested)  LE MMT: MMT (out of 5) Right  Left   Hip flexion 4 4  Hip extension    Hip abduction 4 4  Hip adduction    Hip internal rotation 4+ 4+  Hip external rotation 4+ 4+  Knee flexion 5 5  Knee extension 5 5  Ankle dorsiflexion 5 5  Ankle plantarflexion 5 5  Ankle inversion    Ankle eversion    (* = pain; Blank rows = not tested)  Sensation Deferred  Reflexes Deferred   Muscle Length Hamstrings: R: Negative L: Positive for tightness and concordant pain (30 knee flexion)  [place] (quadriceps): R: Not done L: Not done Thomas (hip flexors): R: Not done L: Not  done [employer]: R: Not done L: Not done  Palpation Location Right Left         Lumbar paraspinals 0 1  Quadratus Lumborum 0 0  Iliac Crest    ASIS 0 0  Pubic Rami    Pubic symphysis   Femoral Triangle    Inguinal ligament    Gluteus Maximus 0 0  Gluteus Medius 0 1  Deep hip external rotators 0 2  Sacrum   PSIS    Fortin's Area (SIJ) 0 0  Coccyx   Ischial Tuberosity 0 0  Greater Trochanter 0 0  (Blank rows = not tested) Graded on 0-4 scale (0 = no pain, 1 = pain, 2 = pain with wincing/grimacing/flinching, 3 = pain with withdrawal, 4 = unwilling to allow palpation)  Passive Accessory Intervertebral Motion Pt denies reproduction of hip pain with CPA L1-L5. UPA at L4 segment with report of concordant pain with radicular symptoms down LLE. Reduction of pain with repeated prone press up.   Special Tests Lumbar Radiculopathy and Discogenic: Centralization and Peripheralization (SN 92, -LR 0.12): Not done Slump (SN 83, -LR 0.32): R: Not done L: Not done SLR (SN 92, -LR 0.29): R: Negative L:  Negative Crossed SLR (SP 90): R: Negative L: Negative  Facet Joint: Extension-Rotation (SN 100, -LR 0.0): R: Not done L: Not done  Lumbar Foraminal Stenosis: Lumbar quadrant (SN 70): R: Not done L: Not done  Hip: FABER (SN 81): R: Negative L: Negative FADIR (SN 94): R: Reported concordant pain L: Negative Hip scour (SN 50): R: Negative L: Negative  SIJ:  Thigh Thrust (SN 88, -LR 0.18) : R: Negative L: Negative  Piriformis Syndrome: FAIR Test (SN 88, SP 83): R: Not done L: Not done  Physical Performance Measures (Deferred):   TODAY'S TREATMENT:  Subjective: Patient reported soreness after last session, pain has slightly improved in lower back.   Pain: Denies Resting Pain  Therapeutic Exercise:  Manual Piriformis Light Stretch 30s/bout x 2 bouts Manual Figure 4 Light Stretch 30s/bout x 2 bouts  Supine Bridge 2 x 10; (HEP)  R Sidelying Clamshell (LLE) 2 x 12; (HEP) R Sidelying  Hip Abduction, added manual resistance with 2nd set (LLE) 2 x 12;  Manual Therapy: STM to deep gluteals, piriformis with myofascial release technique.;  Prone CPA to L3-L5 30s/bout x 1 bout per segment; Prone UPA to L4-L5 30s/bout x 2 bouts; Lumbar Long Axis Distraction 30s/bout x 3 bouts;   Trigger Point Dry Needling (TDN), unbilled Education previously performed with patient regarding potential benefit of TDN. Previously reviewed precautions and risks with patient. Extensive time spent with pt to ensure full understanding of TDN risks. Pt provided verbal consent to treatment. With pt in prone using clean technique TDN performed to bilateral lumbar multifidi at L4 and L5 with 4, 0.30 x 75 single needle placements (one in each location on each side) with deep ache reported. Pistoning technique utilized. Improved pain-free motion following session.   PATIENT EDUCATION:  Education details: Plan of Care and HEP  Person educated: Patient Education method: Explanation, Demonstration, and Handouts Education comprehension: verbalized understanding   HOME EXERCISE PROGRAM:  Access Code: [email or web address] URL: [email or web address] Date: 07/27/2023 Prepared by: Ria Comment  Exercises - Supine Piriformis Stretch  - 1 x daily - 7 x weekly - 3 sets - 10 reps - Seated Piriformis Stretch with Trunk Bend  - 1 x daily - 7 x weekly - 3 sets - 10 reps - Standing Lumbar Extension  - 6  x daily - 7 x weekly - 2 sets - 10 reps - 3s hold   ASSESSMENT:  CLINICAL IMPRESSION: Patient arrived to physical therapy highly motivated to improve left hip pain. Patient presents with improved pain and responded well to dry needling last session. Patient still presenting with pain localized in lower lumbar segments (L3-L5) with CPA and UPA, improved with repeated bouts of CPAs. Patient continues to be adherent with HEP stretches; PT added additional exercises in order to strengthen deep gluteals and core  stabilization. PT performed dry needling to bilateral lumbar multifidi. Plan to continue reducing pain and improving LE strengthening. Based on today's session pt will continue to benefit from PT services to address deficits in strength, balance, and mobility in order to return to full function at home.   OBJECTIVE IMPAIRMENTS: decreased strength, increased muscle spasms, impaired sensation, and pain.   ACTIVITY LIMITATIONS: sitting and sleeping  PARTICIPATION LIMITATIONS: driving, occupation, and yard work  PERSONAL FACTORS: Age, Behavior pattern, and Profession are also affecting patient's functional outcome.   REHAB POTENTIAL: Excellent  CLINICAL DECISION MAKING: Evolving/moderate complexity  EVALUATION COMPLEXITY: Moderate   GOALS: Goals reviewed with patient? No  SHORT TERM GOALS: Target date: 08/23/2023   Pt will be independent with HEP in order to improve strength and decrease hip pain to improve pain-free function at home and work. Baseline: 07/26/2023: Goal status: INITIAL   LONG TERM GOALS: Target date: 09/20/2023  Pt will increase FOTO to at least 81 to demonstrate significant improvement in function at home and work related to hip pain.  Baseline: 07/26/2023: 73 Goal status: INITIAL  2.  Pt will decrease worst hip pain by at least 3 points on the NPRS in order to demonstrate clinically significant reduction in hip pain. Baseline: 07/26/2023: 5/10 Goal status: INITIAL  3.  Pt will increase HAGOS score by at least 18 points in order demonstrate clinically significant reduction in hip pain/disability.       Baseline: 07/26/2023: 77% Goal status: INITIAL  4. Pt will report at least 5-6 hours of full rest without pain in order to demonstrate improvement in hip pain.  Baseline: 07/26/2023: n/a  Goal status: INITIAL     PLAN: PT FREQUENCY: 1-2x/week  PT DURATION: 8 weeks  PLANNED INTERVENTIONS: Therapeutic exercises, Therapeutic activity, Neuromuscular  re-education, Balance training, Gait training, Patient/Family education, Self Care, Joint mobilization, Joint manipulation, Vestibular training, Canalith repositioning, Orthotic/Fit training, DME instructions, Dry Needling, Electrical stimulation, Spinal manipulation, Spinal mobilization, Cryotherapy, Moist heat, Taping, Traction, Ultrasound, Ionotophoresis 4mg /ml Dexamethasone, Manual therapy, and Re-evaluation.  PLAN FOR NEXT SESSION: UPA L4 Segment, Dry Needling, Progressing Strength protocol.   Kaitlyne Friedhoff SPT Sharalyn Ink Huprich PT, DPT, GCS  Huprich,Jason, PT 08/02/2023, 10:54 AM

## 2023-08-04 ENCOUNTER — Ambulatory Visit: Payer: Self-pay

## 2023-08-09 ENCOUNTER — Ambulatory Visit: Payer: Self-pay

## 2023-08-09 DIAGNOSIS — M25552 Pain in left hip: Secondary | ICD-10-CM

## 2023-08-09 DIAGNOSIS — M6281 Muscle weakness (generalized): Secondary | ICD-10-CM

## 2023-08-09 NOTE — Therapy (Addendum)
OUTPATIENT PHYSICAL THERAPY HIP TREATMENT   Patient Name: Keith York MRN: [id number] DOB:06/13/1971, 52 y.o., male Today's Date: 08/09/2023  END OF SESSION:  PT End of Session - 08/09/23 0804     Visit Number 3    Number of Visits 17    Date for PT Re-Evaluation 09/20/23    Authorization Type Eval: 07/26/2023    PT Start Time 0805    PT Stop Time 0845    PT Time Calculation (min) 40 min    Activity Tolerance Patient tolerated treatment well    Behavior During Therapy [facility] for tasks assessed/performed            Past Medical History:  Diagnosis Date   Allergy    Family history of colonic polyps    Family history of Lynch syndrome    Sinus congestion    Past Surgical History:  Procedure Laterality Date   EPIGASTRIC HERNIA REPAIR N/A 04/29/2021   Procedure: HERNIA REPAIR EPIGASTRIC ADULT;  Surgeon: Earline Mayotte, MD;  Location: [facility] ORS;  Service: General;  Laterality: N/A;   WISDOM TOOTH EXTRACTION  1995   Patient Active Problem List   Diagnosis Date Noted   Genetic testing 01/01/2021   Family history of colonic polyps    Family history of Lynch syndrome    Epigastric hernia 02/18/2016    PCP: Patient, No Pcp Per  REFERRING PROVIDER: Ronne Binning, NP  REFERRING DIAG: [phone] (ICD-10-CM) - Pain in left hip   RATIONALE FOR EVALUATION AND TREATMENT: Rehabilitation  THERAPY DIAG: Pain in left hip  Muscle weakness (generalized)  ONSET DATE: June 1st 2024  FOLLOW-UP APPT SCHEDULED WITH REFERRING PROVIDER: Yes    SUBJECTIVE:                                                                                                                                                                                         SUBJECTIVE STATEMENT:  Left Hip Pain   PERTINENT HISTORY:   07/26/2023:  Patient reports to PT with left hip pain following a fall in June. He reports "he turned a corner, fell, and landed on his hip." Since the fall patient reports intermittent  pain from posterolateral hip towards to the foot. Patient reports aggravation in pain when riding in smaller cars. He reports loss of sleep due to pain and is unable to rest a total of > 5 hours due to onset of pain throughout the night. Minor relief for two weeks after Prednisone taper however pain returned. Patient reports monthly "spinal adjustments" with chiropractor with minimal relief of hip pain. Currently he denies saddle paraesthesia, fevers, chills, or loss of  b/b.   PAIN:    Pain Intensity: Present: 0/10, Best: 0/10, Worst: 5/10 Pain location: Lateral left hip  Pain Quality: intermittent, sharp, and aching  Radiating: Yes, down LLE Numbness/Tingling: Yes Focal Weakness: No Aggravating factors: Prolonged sitting or supine positions Relieving factors: Walking, "Moving around"   24-hour pain behavior: AM: No Pain noted, PM: Pain throughout sleep  History of prior back or hip injury, pain, surgery, or therapy: Yes, history of back pain; Dominant hand: right Imaging: Yes, pt reports normal plain film radiographs; Red flags: Negative for bowel/bladder changes, saddle paresthesia, testicular pain, h/o kidney stones, h/o spinal tumors, h/o compression fx, personal history of cancer/malignancy (especially prostate or pelvic), acute hip trauma, night pain, h/o abdominal aneurysm, abdominal pain, chills/fever, night sweats, nausea, vomiting, diarrhea, unexplained weight gain/loss;  PRECAUTIONS: None  WEIGHT BEARING RESTRICTIONS: No  FALLS: Has patient fallen in last 6 months? Yes. 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     OBJECTIVE:  Patient Surveys  FOTO: 77, predicted 81 HAGOS: HAGOS Score: 77.7%   Cognition Patient is oriented to person, place, and time.  Recent memory is intact.  Remote memory is intact.  Attention span and concentration are intact.  Expressive speech is intact.  Patient's fund of knowledge is within normal limits for educational level.    Gross Musculoskeletal Assessment Tremor: None; Bulk: Normal, no muscle wasting noted; Tone: Notable increased tension in left lumbar paraspinals ; No visible step-off along spinal column, no signs of scoliosis, no gross anatomical hip deformities; No erythema, edema, or ecchymosis noted;  GAIT: (Deferred)  Posture: Lumbar lordosis: WNL Iliac crest height: Equal bilaterally Lumbar lateral shift: Negative Leg length: Equal bilaterally;  AROM AROM (Normal range in degrees) AROM   Lumbar   Flexion (65) WNL   Extension (30) WNL  Right lateral flexion (25)   Left lateral flexion (25)   Right rotation (30)   Left rotation (30)       Hip Right Left  Flexion (125) WNL WNL  Extension (15)    Abduction (40)    Adduction (30)    Internal Rotation (45) WNL WNL  External Rotation (45) WNL WNL      Knee    Flexion (135) WNL WNL  Extension (0) WNL WNL      Ankle    Dorsiflexion (20) WNL WNL  Plantarflexion (50) WNL WNL  (* = pain; Blank rows = not tested)  LE MMT: MMT (out of 5) Right  Left   Hip flexion 4 4  Hip extension    Hip abduction 4 4  Hip adduction    Hip internal rotation 4+ 4+  Hip external rotation 4+ 4+  Knee flexion 5 5  Knee extension 5 5  Ankle dorsiflexion 5 5  Ankle plantarflexion 5 5  Ankle inversion    Ankle eversion    (* = pain; Blank rows = not tested)  Sensation Deferred  Reflexes Deferred   Muscle Length Hamstrings: R: Negative L: Positive for tightness and concordant pain (30 knee flexion)  Ely (quadriceps): R: Not done L: Not done Thomas (hip flexors): R: Not done L: Not  done [employer]: R: Not done L: Not done  Palpation Location Right Left         Lumbar paraspinals 0 1  Quadratus Lumborum 0 0  Iliac Crest    ASIS 0 0  Pubic Rami    Pubic symphysis   Femoral Triangle    Inguinal ligament    Gluteus Maximus 0 0  Gluteus Medius 0 1  Deep hip external rotators 0 2  Sacrum   PSIS    Fortin's Area (SIJ) 0 0  Coccyx   Ischial Tuberosity 0 0  Greater Trochanter 0 0  (Blank rows = not tested) Graded on 0-4 scale (0 = no pain, 1 = pain, 2 = pain with wincing/grimacing/flinching, 3 = pain with withdrawal, 4 = unwilling to allow palpation)  Passive Accessory Intervertebral Motion Pt denies reproduction of hip pain with CPA L1-L5. UPA at L4 segment with report of concordant pain with radicular symptoms down LLE. Reduction of pain with repeated prone press up.   Special Tests Lumbar Radiculopathy and Discogenic: Centralization and Peripheralization (SN 92, -LR 0.12): Not done Slump (SN 83, -LR 0.32): R: Not done L: Not done SLR (SN 92, -LR 0.29): R: Negative L:  Negative Crossed SLR (SP 90): R: Negative L: Negative  Facet Joint: Extension-Rotation (SN 100, -LR 0.0): R: Not done L: Not done  Lumbar Foraminal Stenosis: Lumbar quadrant (SN 70): R: Not done L: Not done  Hip: FABER (SN 81): R: Negative L: Negative FADIR (SN 94): R: Reported concordant pain L: Negative Hip scour (SN 50): R: Negative L: Negative  SIJ:  Thigh Thrust (SN 88, -LR 0.18) : R: Negative L: Negative  Piriformis Syndrome: FAIR Test (SN 88, SP 83): R: Not done L: Not done  Physical Performance Measures (Deferred):   TODAY'S TREATMENT:  Subjective: Patient arrived to physical therapy with improved pain (approximately 75% improvement). Pt reported less frequency with radiating pain. No further question or concerns.  Pain: Denies Resting Pain  Therapeutic Exercise:  Manual Crossover  Light Stretch 30s/bout x 2 bouts  Cat Cow 2 x 10;  Isometric Dead bug 30s/bout x 2;  Dead  Bug 2 x 12;   Bird Dog 2 x 10 BLE; Nautilus Pallof Press 40# 2 x 12 ea side;   Pt educated throughout session about proper posture and technique with exercises. Improved exercise technique, movement at target joints, use of target muscles after min to mod verbal, visual, tactile cues.  Manual Therapy STM to deep gluteals, piriformis with myofascial release technique.;  Prone CPA to L3-L5 30s/bout x 1 bout per segment; Prone UPA to L. L4-L5 30s/bout x 2 bouts;  Trigger Point Dry Needling (TDN), unbilled Education previously performed with patient regarding potential benefit of TDN. Previously reviewed precautions and risks with patient. Extensive time spent with pt to ensure full understanding of TDN risks. Pt provided verbal consent to treatment. With pt in prone using clean technique TDN performed to bilateral lumbar multifidi at L4 and L5 with 4, 0.30 x 75 single needle placements (one in each location on each side) with deep ache reported. Pistoning technique utilized. Improved pain-free motion following session.    PATIENT EDUCATION:  Education details: Plan of Care and HEP  Person educated: Patient Education method: Explanation, Demonstration, and Handouts Education comprehension: verbalized understanding   HOME EXERCISE PROGRAM:  Access Code: [email or web address] URL: [email or web address] Date: 07/27/2023 Prepared by: Ria Comment  Exercises - Supine Piriformis Stretch  - 1 x daily - 7 x weekly - 3 sets -  10 reps - Seated Piriformis Stretch with Trunk Bend  - 1 x daily - 7 x weekly - 3 sets - 10 reps - Standing Lumbar Extension  - 6 x daily - 7 x weekly - 2 sets - 10 reps - 3s hold   ASSESSMENT:  CLINICAL IMPRESSION: Patient arrived to physical therapy highly motivated to improve pain in hip and lumbar region. Patient's pain centralized from hip to lumbar/multifidi region. Patient still presenting with pain localized in lower lumbar segments (L3-L5) with CPA and UPA,  improved with repeated bouts of CPAs and dry needling. Today's session, pt tolerated exercises targeting core stabilization and multifidi muscle group without additional report of pain. PT performed dry needling to bilateral lumbar multifidi. Plan to continue reducing pain and improving LE strengthening. Based on today's session pt will continue to benefit from PT services to address deficits in strength, balance, and mobility in order to return to full function at home.   OBJECTIVE IMPAIRMENTS: decreased strength, increased muscle spasms, impaired sensation, and pain.   ACTIVITY LIMITATIONS: sitting and sleeping  PARTICIPATION LIMITATIONS: driving, occupation, and yard work  PERSONAL FACTORS: Age, Behavior pattern, and Profession are also affecting patient's functional outcome.   REHAB POTENTIAL: Excellent  CLINICAL DECISION MAKING: Evolving/moderate complexity  EVALUATION COMPLEXITY: Moderate   GOALS: Goals reviewed with patient? No  SHORT TERM GOALS: Target date: 08/23/2023   Pt will be independent with HEP in order to improve strength and decrease hip pain to improve pain-free function at home and work. Baseline: 07/26/2023: Goal status: INITIAL   LONG TERM GOALS: Target date: 09/20/2023  Pt will increase FOTO to at least 81 to demonstrate significant improvement in function at home and work related to hip pain.  Baseline: 07/26/2023: 73 Goal status: INITIAL  2.  Pt will decrease worst hip pain by at least 3 points on the NPRS in order to demonstrate clinically significant reduction in hip pain. Baseline: 07/26/2023: 5/10 Goal status: INITIAL  3.  Pt will increase HAGOS score by at least 18 points in order demonstrate clinically significant reduction in hip pain/disability.       Baseline: 07/26/2023: 77% Goal status: INITIAL  4. Pt will report at least 5-6 hours of full rest without pain in order to demonstrate improvement in hip pain.  Baseline: 07/26/2023: n/a  Goal  status: INITIAL     PLAN: PT FREQUENCY: 1-2x/week  PT DURATION: 8 weeks  PLANNED INTERVENTIONS: Therapeutic exercises, Therapeutic activity, Neuromuscular re-education, Balance training, Gait training, Patient/Family education, Self Care, Joint mobilization, Joint manipulation, Vestibular training, Canalith repositioning, Orthotic/Fit training, DME instructions, Dry Needling, Electrical stimulation, Spinal manipulation, Spinal mobilization, Cryotherapy, Moist heat, Taping, Traction, Ultrasound, Ionotophoresis 4mg /ml Dexamethasone, Manual therapy, and Re-evaluation.  PLAN FOR NEXT SESSION:  Dry Needling, Progressing Strength protocol.   Leotta Weingarten SPT Sharalyn Ink Huprich PT, DPT, GCS  Huprich,Jason, PT 08/09/2023, 11:34 AM

## 2023-08-11 ENCOUNTER — Ambulatory Visit: Payer: Self-pay

## 2023-08-16 ENCOUNTER — Ambulatory Visit: Payer: Self-pay

## 2023-08-16 DIAGNOSIS — M6281 Muscle weakness (generalized): Secondary | ICD-10-CM

## 2023-08-16 DIAGNOSIS — M25552 Pain in left hip: Secondary | ICD-10-CM

## 2023-08-16 NOTE — Therapy (Addendum)
OUTPATIENT PHYSICAL THERAPY HIP TREATMENT   Patient Name: Keith York MRN: [id number] DOB:[birth year], 52 y.o., male Today's Date: 08/16/2023  END OF SESSION:  PT End of Session - 08/16/23 0806     Visit Number 4    Number of Visits 17    Date for PT Re-Evaluation 09/20/23    Authorization Type Eval: 07/26/2023    PT Start Time 0805    PT Stop Time 0845    PT Time Calculation (min) 40 min    Activity Tolerance Patient tolerated treatment well    Behavior During Therapy [facility] for tasks assessed/performed            Past Medical History:  Diagnosis Date   Allergy    Family history of colonic polyps    Family history of Lynch syndrome    Sinus congestion    Past Surgical History:  Procedure Laterality Date   EPIGASTRIC HERNIA REPAIR N/A 04/29/2021   Procedure: HERNIA REPAIR EPIGASTRIC ADULT;  Surgeon: Earline Mayotte, MD;  Location: [facility] ORS;  Service: General;  Laterality: N/A;   WISDOM TOOTH EXTRACTION  1995   Patient Active Problem List   Diagnosis Date Noted   Genetic testing 01/01/2021   Family history of colonic polyps    Family history of Lynch syndrome    Epigastric hernia 02/18/2016    PCP: Patient, No Pcp Per  REFERRING PROVIDER: Ronne Binning, NP  REFERRING DIAG: [phone] (ICD-10-CM) - Pain in left hip   RATIONALE FOR EVALUATION AND TREATMENT: Rehabilitation  THERAPY DIAG: Pain in left hip  Muscle weakness (generalized)  ONSET DATE: June 1st 2024  FOLLOW-UP APPT SCHEDULED WITH REFERRING PROVIDER: Yes    SUBJECTIVE:                                                                                                                                                                                         SUBJECTIVE STATEMENT:  Left Hip Pain   PERTINENT HISTORY:   07/26/2023:  Patient reports to PT with left hip pain following a fall in June. He reports "he turned a corner, fell, and landed on his hip." Since the fall patient reports intermittent  pain from posterolateral hip towards to the foot. Patient reports aggravation in pain when riding in smaller cars. He reports loss of sleep due to pain and is unable to rest a total of > 5 hours due to onset of pain throughout the night. Minor relief for two weeks after Prednisone taper however pain returned. Patient reports monthly "spinal adjustments" with chiropractor with minimal relief of hip pain. Currently he denies saddle paraesthesia, fevers, chills, or loss of  b/b.   PAIN:    Pain Intensity: Present: 0/10, Best: 0/10, Worst: 5/10 Pain location: Lateral left hip  Pain Quality: intermittent, sharp, and aching  Radiating: Yes, down LLE Numbness/Tingling: Yes Focal Weakness: No Aggravating factors: Prolonged sitting or supine positions Relieving factors: Walking, "Moving around"   24-hour pain behavior: AM: No Pain noted, PM: Pain throughout sleep  History of prior back or hip injury, pain, surgery, or therapy: Yes, history of back pain; Dominant hand: right Imaging: Yes, pt reports normal plain film radiographs; Red flags: Negative for bowel/bladder changes, saddle paresthesia, testicular pain, h/o kidney stones, h/o spinal tumors, h/o compression fx, personal history of cancer/malignancy (especially prostate or pelvic), acute hip trauma, night pain, h/o abdominal aneurysm, abdominal pain, chills/fever, night sweats, nausea, vomiting, diarrhea, unexplained weight gain/loss;  PRECAUTIONS: None  WEIGHT BEARING RESTRICTIONS: No  FALLS: Has patient fallen in last 6 months? Yes. 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     OBJECTIVE:  Patient Surveys  FOTO: 27, predicted 81 HAGOS: HAGOS Score: 77.7%   Cognition Patient is oriented to person, place, and time.  Recent memory is intact.  Remote memory is intact.  Attention span and concentration are intact.  Expressive speech is intact.  Patient's fund of knowledge is within normal limits for educational level.    Gross Musculoskeletal Assessment Tremor: None; Bulk: Normal, no muscle wasting noted; Tone: Notable increased tension in left lumbar paraspinals ; No visible step-off along spinal column, no signs of scoliosis, no gross anatomical hip deformities; No erythema, edema, or ecchymosis noted;  GAIT: (Deferred)  Posture: Lumbar lordosis: WNL Iliac crest height: Equal bilaterally Lumbar lateral shift: Negative Leg length: Equal bilaterally;  AROM AROM (Normal range in degrees) AROM   Lumbar   Flexion (65) WNL   Extension (30) WNL  Right lateral flexion (25)   Left lateral flexion (25)   Right rotation (30)   Left rotation (30)       Hip Right Left  Flexion (125) WNL WNL  Extension (15)    Abduction (40)    Adduction (30)    Internal Rotation (45) WNL WNL  External Rotation (45) WNL WNL      Knee    Flexion (135) WNL WNL  Extension (0) WNL WNL      Ankle    Dorsiflexion (20) WNL WNL  Plantarflexion (50) WNL WNL  (* = pain; Blank rows = not tested)  LE MMT: MMT (out of 5) Right  Left   Hip flexion 4 4  Hip extension    Hip abduction 4 4  Hip adduction    Hip internal rotation 4+ 4+  Hip external rotation 4+ 4+  Knee flexion 5 5  Knee extension 5 5  Ankle dorsiflexion 5 5  Ankle plantarflexion 5 5  Ankle inversion    Ankle eversion    (* = pain; Blank rows = not tested)  Sensation Deferred  Reflexes Deferred   Muscle Length Hamstrings: R: Negative L: Positive for tightness and concordant pain (30 knee flexion)  E[place] (quadriceps): R: Not done L: Not done Thomas (hip flexors): R: Not done L: Not  done [employer]: R: Not done L: Not done  Palpation Location Right Left         Lumbar paraspinals 0 1  Quadratus Lumborum 0 0  Iliac Crest    ASIS 0 0  Pubic Rami    Pubic symphysis   Femoral Triangle    Inguinal ligament    Gluteus Maximus 0 0  Gluteus Medius 0 1  Deep hip external rotators 0 2  Sacrum   PSIS    Fortin's Area (SIJ) 0 0  Coccyx   Ischial Tuberosity 0 0  Greater Trochanter 0 0  (Blank rows = not tested) Graded on 0-4 scale (0 = no pain, 1 = pain, 2 = pain with wincing/grimacing/flinching, 3 = pain with withdrawal, 4 = unwilling to allow palpation)  Passive Accessory Intervertebral Motion Pt denies reproduction of hip pain with CPA L1-L5. UPA at L4 segment with report of concordant pain with radicular symptoms down LLE. Reduction of pain with repeated prone press up.   Special Tests Lumbar Radiculopathy and Discogenic: Centralization and Peripheralization (SN 92, -LR 0.12): Not done Slump (SN 83, -LR 0.32): R: Not done L: Not done SLR (SN 92, -LR 0.29): R: Negative L:  Negative Crossed SLR (SP 90): R: Negative L: Negative  Facet Joint: Extension-Rotation (SN 100, -LR 0.0): R: Not done L: Not done  Lumbar Foraminal Stenosis: Lumbar quadrant (SN 70): R: Not done L: Not done  Hip: FABER (SN 81): R: Negative L: Negative FADIR (SN 94): R: Reported concordant pain L: Negative Hip scour (SN 50): R: Negative L: Negative  SIJ:  Thigh Thrust (SN 88, -LR 0.18) : R: Negative L: Negative  Piriformis Syndrome: FAIR Test (SN 88, SP 83): R: Not done L: Not done  Physical Performance Measures (Deferred):   TODAY'S TREATMENT:  Subjective: Patient reports one instance of back pain following painful positioning in truck. No further or concerns.   Pain: Denies Resting Pain  Therapeutic Exercise:  NuStep Level 1-4 x 7 min for warm up and history interval;  Lateral Stepping with Blue Theraband at ankles 2 x 40 ft;  Hip Hike on Stair Step 2 x 8 BLE; (pain with  TRX  Partial Squat with blue theraband around thighs  2 x 10 Isometric Dead bug 30s/bout x 2;  Dead Bug with  physio ball 1 x 12 (minor pain in left hip);   Bird Dog 1 x 10 BLE; Nautilus Pallof Press 40# 2 x 12 ea side;  Nautilus Horizontal Torso Rotation 2 x 12, 50#   Pt educated throughout session about proper posture and technique with exercises. Improved exercise technique, movement at target joints, use of target muscles after min to mod verbal, visual, tactile cues.   Manual Therapy STM to deep gluteals, piriformis with myofascial release technique; CPA L4-L5, grade III, 30s/bout x 2 bouts/lvl; L UPA L4-L5, grade III, 30s/bout x 2 bouts/lvl; Prone press up with overpressure at L4/5 x 10;   Trigger Point Dry Needling (TDN), unbilled Education previously performed with patient regarding potential benefit of TDN. Previously reviewed precautions and risks with patient. Extensive time spent with pt to ensure full understanding of TDN risks. Pt provided verbal consent to treatment. With pt in prone using clean technique TDN performed to L lumbar multifidi (L5) and L piriformis with 2, 0.30 x 75 single needle placements (one in each location) with deep ache reported. Pistoning technique utilized.    PATIENT EDUCATION:  Education details: Plan of Care and HEP  Person educated: Patient  Education method: Explanation, Demonstration, and Handouts Education comprehension: verbalized understanding   HOME EXERCISE PROGRAM:  Access Code: [email or web address] URL: [email or web address] Date: 07/27/2023 Prepared by: Ria Comment  Exercises - Supine Piriformis Stretch  - 1 x daily - 7 x weekly - 3 sets - 10 reps - Seated Piriformis Stretch with Trunk Bend  - 1 x daily - 7 x weekly - 3 sets - 10 reps - Standing Lumbar Extension  - 6 x daily - 7 x weekly - 2 sets - 10 reps - 3s hold   ASSESSMENT:  CLINICAL IMPRESSION: Patient arrived to physical therapy highly motivated to improve pain in  hip and lumbar region. Today's session with a main focus on improving LE strength and core stabilization. Patient's pain centralized today to the left hip; still presenting with intermittent radiating pain. Today's session, pt tolerated exercises targeting gluteus medius and multifidi without report of additional pain. Patient able to perform rotational movement on nautilus. PT performed dry needling to lumbar multifidi and gluteal medius. Plan to continue reducing pain and improving LE strengthening. Based on today's session pt will continue to benefit from PT services to address deficits in strength, balance, and mobility in order to return to full function at home.   OBJECTIVE IMPAIRMENTS: decreased strength, increased muscle spasms, impaired sensation, and pain.   ACTIVITY LIMITATIONS: sitting and sleeping  PARTICIPATION LIMITATIONS: driving, occupation, and yard work  PERSONAL FACTORS: Age, Behavior pattern, and Profession are also affecting patient's functional outcome.   REHAB POTENTIAL: Excellent  CLINICAL DECISION MAKING: Evolving/moderate complexity  EVALUATION COMPLEXITY: Moderate   GOALS: Goals reviewed with patient? No  SHORT TERM GOALS: Target date: 08/23/2023   Pt will be independent with HEP in order to improve strength and decrease hip pain to improve pain-free function at home and work. Baseline: 07/26/2023: Goal status: INITIAL   LONG TERM GOALS: Target date: 09/20/2023  Pt will increase FOTO to at least 81 to demonstrate significant improvement in function at home and work related to hip pain.  Baseline: 07/26/2023: 73 Goal status: INITIAL  2.  Pt will decrease worst hip pain by at least 3 points on the NPRS in order to demonstrate clinically significant reduction in hip pain. Baseline: 07/26/2023: 5/10 Goal status: INITIAL  3.  Pt will increase HAGOS score by at least 18 points in order demonstrate clinically significant reduction in hip pain/disability.        Baseline: 07/26/2023: 77% Goal status: INITIAL  4. Pt will report at least 5-6 hours of full rest without pain in order to demonstrate improvement in hip pain.  Baseline: 07/26/2023: n/a  Goal status: INITIAL     PLAN: PT FREQUENCY: 1-2x/week  PT DURATION: 8 weeks  PLANNED INTERVENTIONS: Therapeutic exercises, Therapeutic activity, Neuromuscular re-education, Balance training, Gait training, Patient/Family education, Self Care, Joint mobilization, Joint manipulation, Vestibular training, Canalith repositioning, Orthotic/Fit training, DME instructions, Dry Needling, Electrical stimulation, Spinal manipulation, Spinal mobilization, Cryotherapy, Moist heat, Taping, Traction, Ultrasound, Ionotophoresis 4mg /ml Dexamethasone, Manual therapy, and Re-evaluation.  PLAN FOR NEXT SESSION:  Dry Needling, Progressing Strength protocol.   Haru Anspaugh SPT Sharalyn Ink Huprich PT, DPT, GCS  Huprich,Jason, PT 08/16/2023, 4:08 PM

## 2023-08-18 ENCOUNTER — Ambulatory Visit: Payer: Self-pay

## 2023-08-18 DIAGNOSIS — M25552 Pain in left hip: Secondary | ICD-10-CM

## 2023-08-18 DIAGNOSIS — M6281 Muscle weakness (generalized): Secondary | ICD-10-CM

## 2023-08-18 NOTE — Therapy (Addendum)
OUTPATIENT PHYSICAL THERAPY HIP TREATMENT   Patient Name: Keith York MRN: [id number] DOB:[birth year], 52 y.o., male Today's Date: 08/18/2023  END OF SESSION:  PT End of Session - 08/18/23 1016     Visit Number 5    Number of Visits 17    Date for PT Re-Evaluation 09/20/23    Authorization Type Eval: 07/26/2023    PT Start Time 1015    PT Stop Time 1055    PT Time Calculation (min) 40 min    Activity Tolerance Patient tolerated treatment well    Behavior During Therapy [facility] for tasks assessed/performed            Past Medical History:  Diagnosis Date   Allergy    Family history of colonic polyps    Family history of Lynch syndrome    Sinus congestion    Past Surgical History:  Procedure Laterality Date   EPIGASTRIC HERNIA REPAIR N/A 04/29/2021   Procedure: HERNIA REPAIR EPIGASTRIC ADULT;  Surgeon: Earline Mayotte, MD;  Location: [facility] ORS;  Service: General;  Laterality: N/A;   WISDOM TOOTH EXTRACTION  1995   Patient Active Problem List   Diagnosis Date Noted   Genetic testing 01/01/2021   Family history of colonic polyps    Family history of Lynch syndrome    Epigastric hernia 02/18/2016    PCP: Patient, No Pcp Per  REFERRING PROVIDER: Ronne Binning, NP  REFERRING DIAG: [phone] (ICD-10-CM) - Pain in left hip   RATIONALE FOR EVALUATION AND TREATMENT: Rehabilitation  THERAPY DIAG: Pain in left hip  Muscle weakness (generalized)  ONSET DATE: June 1st 2024  FOLLOW-UP APPT SCHEDULED WITH REFERRING PROVIDER: Yes    SUBJECTIVE:                                                                                                                                                                                         SUBJECTIVE STATEMENT:  Left Hip Pain   PERTINENT HISTORY:   07/26/2023:  Patient reports to PT with left hip pain following a fall in June. He reports "he turned a corner, fell, and landed on his hip." Since the fall patient reports intermittent  pain from posterolateral hip towards to the foot. Patient reports aggravation in pain when riding in smaller cars. He reports loss of sleep due to pain and is unable to rest a total of > 5 hours due to onset of pain throughout the night. Minor relief for two weeks after Prednisone taper however pain returned. Patient reports monthly "spinal adjustments" with chiropractor with minimal relief of hip pain. Currently he denies saddle paraesthesia, fevers, chills, or loss of  b/b.   PAIN:    Pain Intensity: Present: 0/10, Best: 0/10, Worst: 5/10 Pain location: Lateral left hip  Pain Quality: intermittent, sharp, and aching  Radiating: Yes, down LLE Numbness/Tingling: Yes Focal Weakness: No Aggravating factors: Prolonged sitting or supine positions Relieving factors: Walking, "Moving around"   24-hour pain behavior: AM: No Pain noted, PM: Pain throughout sleep  History of prior back or hip injury, pain, surgery, or therapy: Yes, history of back pain; Dominant hand: right Imaging: Yes, pt reports normal plain film radiographs; Red flags: Negative for bowel/bladder changes, saddle paresthesia, testicular pain, h/o kidney stones, h/o spinal tumors, h/o compression fx, personal history of cancer/malignancy (especially prostate or pelvic), acute hip trauma, night pain, h/o abdominal aneurysm, abdominal pain, chills/fever, night sweats, nausea, vomiting, diarrhea, unexplained weight gain/loss;  PRECAUTIONS: None  WEIGHT BEARING RESTRICTIONS: No  FALLS: Has patient fallen in last 6 months? Yes. 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     OBJECTIVE:  Patient Surveys  FOTO: 16, predicted 81 HAGOS: HAGOS Score: 77.7%   Cognition Patient is oriented to person, place, and time.  Recent memory is intact.  Remote memory is intact.  Attention span and concentration are intact.  Expressive speech is intact.  Patient's fund of knowledge is within normal limits for educational level.    Gross Musculoskeletal Assessment Tremor: None; Bulk: Normal, no muscle wasting noted; Tone: Notable increased tension in left lumbar paraspinals ; No visible step-off along spinal column, no signs of scoliosis, no gross anatomical hip deformities; No erythema, edema, or ecchymosis noted;  GAIT: (Deferred)  Posture: Lumbar lordosis: WNL Iliac crest height: Equal bilaterally Lumbar lateral shift: Negative Leg length: Equal bilaterally;  AROM AROM (Normal range in degrees) AROM   Lumbar   Flexion (65) WNL   Extension (30) WNL  Right lateral flexion (25)   Left lateral flexion (25)   Right rotation (30)   Left rotation (30)       Hip Right Left  Flexion (125) WNL WNL  Extension (15)    Abduction (40)    Adduction (30)    Internal Rotation (45) WNL WNL  External Rotation (45) WNL WNL      Knee    Flexion (135) WNL WNL  Extension (0) WNL WNL      Ankle    Dorsiflexion (20) WNL WNL  Plantarflexion (50) WNL WNL  (* = pain; Blank rows = not tested)  LE MMT: MMT (out of 5) Right  Left   Hip flexion 4 4  Hip extension    Hip abduction 4 4  Hip adduction    Hip internal rotation 4+ 4+  Hip external rotation 4+ 4+  Knee flexion 5 5  Knee extension 5 5  Ankle dorsiflexion 5 5  Ankle plantarflexion 5 5  Ankle inversion    Ankle eversion    (* = pain; Blank rows = not tested)  Sensation Deferred  Reflexes Deferred   Muscle Length Hamstrings: R: Negative L: Positive for tightness and concordant pain (30 knee flexion)  Ely (quadriceps): R: Not done L: Not done Thomas (hip flexors): R: Not done L: Not  done [employer]: R: Not done L: Not done  Palpation Location Right Left         Lumbar paraspinals 0 1  Quadratus Lumborum 0 0  Iliac Crest    ASIS 0 0  Pubic Rami    Pubic symphysis   Femoral Triangle    Inguinal ligament    Gluteus Maximus 0 0  Gluteus Medius 0 1  Deep hip external rotators 0 2  Sacrum   PSIS    Fortin's Area (SIJ) 0 0  Coccyx   Ischial Tuberosity 0 0  Greater Trochanter 0 0  (Blank rows = not tested) Graded on 0-4 scale (0 = no pain, 1 = pain, 2 = pain with wincing/grimacing/flinching, 3 = pain with withdrawal, 4 = unwilling to allow palpation)  Passive Accessory Intervertebral Motion Pt denies reproduction of hip pain with CPA L1-L5. UPA at L4 segment with report of concordant pain with radicular symptoms down LLE. Reduction of pain with repeated prone press up.   Special Tests Lumbar Radiculopathy and Discogenic: Centralization and Peripheralization (SN 92, -LR 0.12): Not done Slump (SN 83, -LR 0.32): R: Not done L: Not done SLR (SN 92, -LR 0.29): R: Negative L:  Negative Crossed SLR (SP 90): R: Negative L: Negative  Facet Joint: Extension-Rotation (SN 100, -LR 0.0): R: Not done L: Not done  Lumbar Foraminal Stenosis: Lumbar quadrant (SN 70): R: Not done L: Not done  Hip: FABER (SN 81): R: Negative L: Negative FADIR (SN 94): R: Reported concordant pain L: Negative Hip scour (SN 50): R: Negative L: Negative  SIJ:  Thigh Thrust (SN 88, -LR 0.18) : R: Negative L: Negative  Piriformis Syndrome: FAIR Test (SN 88, SP 83): R: Not done L: Not done  Physical Performance Measures (Deferred):   TODAY'S TREATMENT:   Subjective: Patient arrives to physical therapy with no new reports. No further or concerns.    Pain: 1/10 Lumbar Region   Therapeutic Exercise:  Isometric Dead bug against manual perturbation 30s/bout x 2;  [facility] with 9# Dumbbell;  Egbert Garibaldi Dog 2 x 10 x 2 sec hold BLE; Sit to Stand with Blue Theraband around thighs 1 x 10;   Lateral Stepping with Blue Theraband at thighs 4 x 28 ft;  Lateral Stepping with Blue Theraband at ankles 4 x 28 ft;  R Side lying Hip Abduction 3 x 12 (5# ankle weight last set);  Manual Crossover Stretch LLE 30s/bout x 3 bouts;  Childs Pose Stretch 30s/bout x 3 bouts (added overpressure last set);    Manual Therapy STM to deep gluteals, piriformis with myofascial release technique; CPA L4-L5, grade III, 30s/bout x 2 bouts/lvl; L UPA L4-L5, grade III, 30s/bout x 2 bouts/lvl; Prone press up with overpressure at L4/5 x 10;   Trigger Point Dry Needling (TDN), unbilled Education previously performed with patient regarding potential benefit of TDN. Previously reviewed precautions and risks with patient. Extensive time spent with pt to ensure full understanding of TDN risks. Pt provided verbal consent to treatment. With pt in prone using clean technique TDN performed to bilateral lumbar multifidi (L4 and L5) with 4, 0.30 x 75 single needle placements with deep ache reported. Pistoning technique utilized.    PATIENT EDUCATION:  Education details: Plan of Care and HEP  Person educated: Patient Education method: Explanation, Demonstration, and Handouts Education comprehension: verbalized understanding   HOME EXERCISE PROGRAM:  Access Code: [email or web address] URL: [email or web address] Date: 07/27/2023 Prepared by: Ria Comment  Exercises - Supine Piriformis Stretch  -  1 x daily - 7 x weekly - 3 sets - 10 reps - Seated Piriformis Stretch with Trunk Bend  - 1 x daily - 7 x weekly - 3 sets - 10 reps - Standing Lumbar Extension  - 6 x daily - 7 x weekly - 2 sets - 10 reps - 3s hold   ASSESSMENT:  CLINICAL IMPRESSION: Patient arrived to physical therapy highly motivated to improve pain in hip and lumbar region. Today's session with a focus on continued core and gluteal strengthening. Pt tolerated increase in intensity with hip abduction resistance. PT performed dry needling to  bilateral lumbar multifidi. Plan to continue reducing pain and improving LE strengthening. Based on today's session pt will continue to benefit from PT services to address deficits in strength, balance, and mobility in order to return to full function at home.   OBJECTIVE IMPAIRMENTS: decreased strength, increased muscle spasms, impaired sensation, and pain.   ACTIVITY LIMITATIONS: sitting and sleeping  PARTICIPATION LIMITATIONS: driving, occupation, and yard work  PERSONAL FACTORS: Age, Behavior pattern, and Profession are also affecting patient's functional outcome.   REHAB POTENTIAL: Excellent  CLINICAL DECISION MAKING: Evolving/moderate complexity  EVALUATION COMPLEXITY: Moderate   GOALS: Goals reviewed with patient? No  SHORT TERM GOALS: Target date: 08/23/2023   Pt will be independent with HEP in order to improve strength and decrease hip pain to improve pain-free function at home and work. Baseline:  Goal status: INITIAL   LONG TERM GOALS: Target date: 09/20/2023  Pt will increase FOTO to at least 81 to demonstrate significant improvement in function at home and work related to hip pain.  Baseline: 07/26/2023: 73 Goal status: INITIAL  2.  Pt will decrease worst hip pain by at least 3 points on the NPRS in order to demonstrate clinically significant reduction in hip pain. Baseline: 07/26/2023: 5/10 Goal status: INITIAL  3.  Pt will increase HAGOS score by at least 18 points in order demonstrate clinically significant reduction in hip pain/disability.       Baseline: 07/26/2023: 77% Goal status: INITIAL  4. Pt will report at least 5-6 hours of full rest without pain in order to demonstrate improvement in hip pain.  Baseline: 07/26/2023: n/a  Goal status: INITIAL     PLAN: PT FREQUENCY: 1-2x/week  PT DURATION: 8 weeks  PLANNED INTERVENTIONS: Therapeutic exercises, Therapeutic activity, Neuromuscular re-education, Balance training, Gait training, Patient/Family  education, Self Care, Joint mobilization, Joint manipulation, Vestibular training, Canalith repositioning, Orthotic/Fit training, DME instructions, Dry Needling, Electrical stimulation, Spinal manipulation, Spinal mobilization, Cryotherapy, Moist heat, Taping, Traction, Ultrasound, Ionotophoresis 4mg /ml Dexamethasone, Manual therapy, and Re-evaluation.  PLAN FOR NEXT SESSION:  Dry Needling, Progressing Strength protocol.   Shalin Vonbargen SPT Sharalyn Ink Huprich PT, DPT, GCS  Huprich,Jason, PT 08/18/2023, 3:52 PM

## 2023-08-23 ENCOUNTER — Ambulatory Visit: Payer: Self-pay | Attending: Family

## 2023-08-23 DIAGNOSIS — M6281 Muscle weakness (generalized): Secondary | ICD-10-CM | POA: Insufficient documentation

## 2023-08-23 DIAGNOSIS — M25552 Pain in left hip: Secondary | ICD-10-CM | POA: Insufficient documentation

## 2023-08-25 ENCOUNTER — Ambulatory Visit: Payer: Self-pay

## 2023-08-25 DIAGNOSIS — M6281 Muscle weakness (generalized): Secondary | ICD-10-CM

## 2023-08-25 DIAGNOSIS — M25552 Pain in left hip: Secondary | ICD-10-CM

## 2023-08-25 NOTE — Therapy (Signed)
OUTPATIENT PHYSICAL THERAPY TREATMENT   Patient Name: Keith York MRN: [id number] DOB:Feb 02, 1971, 52 y.o., male Today's Date: 08/25/2023  END OF SESSION:  PT End of Session - 08/25/23 0803     Visit Number 6    Number of Visits 17    Date for PT Re-Evaluation 09/20/23    Authorization Type Eval: 07/26/2023    PT Start Time 0800    PT Stop Time 0840    PT Time Calculation (min) 40 min    Activity Tolerance Patient tolerated treatment well;No increased pain    Behavior During Therapy [facility] for tasks assessed/performed            Past Medical History:  Diagnosis Date   Allergy    Family history of colonic polyps    Family history of Lynch syndrome    Sinus congestion    Past Surgical History:  Procedure Laterality Date   EPIGASTRIC HERNIA REPAIR N/A 04/29/2021   Procedure: HERNIA REPAIR EPIGASTRIC ADULT;  Surgeon: Earline Mayotte, MD;  Location: [facility] ORS;  Service: General;  Laterality: N/A;   WISDOM TOOTH EXTRACTION  1995   Patient Active Problem List   Diagnosis Date Noted   Genetic testing 01/01/2021   Family history of colonic polyps    Family history of Lynch syndrome    Epigastric hernia 02/18/2016    PCP: Patient, No Pcp Per  REFERRING PROVIDER: Ronne Binning, NP  REFERRING DIAG: [phone] (ICD-10-CM) - Pain in left hip   RATIONALE FOR EVALUATION AND TREATMENT: Rehabilitation  THERAPY DIAG: Pain in left hip  Muscle weakness (generalized)  ONSET DATE: June 1st 2024  FOLLOW-UP APPT SCHEDULED WITH REFERRING PROVIDER: Yes    SUBJECTIVE:                                                                                                                                                                                         SUBJECTIVE STATEMENT:   Pt says he slept with pain waking him. Continues to feel things are going in the right direction.   PERTINENT HISTORY:   07/26/2023:  Patient reports to PT with left hip pain following a fall in June. He  reports "he turned a corner, fell, and landed on his hip." Since the fall patient reports intermittent pain from posterolateral hip towards to the foot. Patient reports aggravation in pain when riding in smaller cars. He reports loss of sleep due to pain and is unable to rest a total of > 5 hours due to onset of pain throughout the night. Minor relief for two weeks after Prednisone taper however pain returned. Patient reports monthly "spinal adjustments" with  chiropractor with minimal relief of hip pain. Currently he denies saddle paraesthesia, fevers, chills, or loss of b/b.   PAIN:    No pain today on arrival   PRECAUTIONS: None  WEIGHT BEARING RESTRICTIONS: No  FALLS: Has patient fallen in last 6 months? Yes. Number of falls 1   OBJECTIVE: FOTO survey: 85 (08/25/23)   TODAY'S TREATMENT:  08/25/23: -AA/ROM Nustep, level 4x4 minutes -deadbug isometric hold 3x30sec  -dead bug single arms with 4lb ball 1x10 bilat  -hooklying bridge x15 -dead bug single leg only 1x20 alternating  -hooklying bridge x15 -hooklying crunch with 8lb ball in hands  -lumbar/hip flexion stretch x60sec  -quadruped bird dog 1x20 alternating -manual triggerpoint release bilat lumber multifidus, Left piriformis, left gluteus medius  -lateral stepping with black TB twice bilat  -retro stepping with black TB     PATIENT EDUCATION:  Education details: tactile/verbal cues for core stabilizaton on bird bogs   Person educated: Patient Education method: Programmer, multimedia, Facilities manager, and Handouts Education comprehension: verbalized understanding   HOME EXERCISE PROGRAM:  Access Code: [email or web address] URL: [email or web address] Date: 07/27/2023 Prepared by: Ria Comment  Exercises - Supine Piriformis Stretch  - 1 x daily - 7 x weekly - 3 sets - 10 reps - Seated Piriformis Stretch with Trunk Bend  - 1 x daily - 7 x weekly - 3 sets - 10 reps - Standing Lumbar Extension  - 6 x daily - 7 x weekly - 2 sets -  10 reps - 3s hold   ASSESSMENT:  CLINICAL IMPRESSION: Continued with program targets at core strength and motor control training. Pt able to complete session without symptoms exacerbation. Pt show signs of improved motor control of lumbopelvis. 2 LT goals assessed and achieved today. Will continue to advance program as appropriate to met all LT goals of care.  OBJECTIVE IMPAIRMENTS: decreased strength, increased muscle spasms, impaired sensation, and pain.   ACTIVITY LIMITATIONS: sitting and sleeping  PARTICIPATION LIMITATIONS: driving, occupation, and yard work  PERSONAL FACTORS: Age, Behavior pattern, and Profession are also affecting patient's functional outcome.   REHAB POTENTIAL: Excellent  CLINICAL DECISION MAKING: Evolving/moderate complexity  EVALUATION COMPLEXITY: Moderate   GOALS: Goals reviewed with patient? No  SHORT TERM GOALS: Target date: 08/23/2023   Pt will be independent with HEP in order to improve strength and decrease hip pain to improve pain-free function at home and work. Baseline:  Goal status: INITIAL   LONG TERM GOALS: Target date: 09/20/2023  Pt will increase FOTO to at least 81 to demonstrate significant improvement in function at home and work related to hip pain.  Baseline: 07/26/2023: 73; 08/25/23: 85 Goal status: ACHEIVED  2.  Pt will decrease worst hip pain by at least 3 points on the NPRS in order to demonstrate clinically significant reduction in hip pain. Baseline: 07/26/2023: 5/10; 08/25/23: 1/10 Goal status: ACHIEVED   3.  Pt will increase HAGOS score by at least 18 points in order demonstrate clinically significant reduction in hip pain/disability.       Baseline: 07/26/2023: 77% Goal status: INITIAL  4. Pt will report at least 5-6 hours of full rest without pain in order to demonstrate improvement in hip pain.  Baseline: 07/26/2023: n/a; 08/25/23: no pain during sleep at night  Goal status: ACHIEVED     PLAN: PT FREQUENCY:  1-2x/week  PT DURATION: 8 weeks  PLANNED INTERVENTIONS: Therapeutic exercises, Therapeutic activity, Neuromuscular re-education, Balance training, Gait training, Patient/Family education, Self Care, Joint mobilization, Joint manipulation, Vestibular training, Canalith repositioning, Orthotic/Fit  training, DME instructions, Dry Needling, Electrical stimulation, Spinal manipulation, Spinal mobilization, Cryotherapy, Moist heat, Taping, Traction, Ultrasound, Ionotophoresis 4mg /ml Dexamethasone, Manual therapy, and Re-evaluation.  PLAN FOR NEXT SESSION:  Dry Needling, Progressing Strength protocol.   8:05 AM, 08/25/23 Rosamaria Lints, PT, DPT Physical Therapist - [facility] in [place]  [phone] (Office)

## 2023-08-30 ENCOUNTER — Ambulatory Visit: Payer: Self-pay

## 2023-08-30 DIAGNOSIS — M25552 Pain in left hip: Secondary | ICD-10-CM

## 2023-08-30 DIAGNOSIS — M6281 Muscle weakness (generalized): Secondary | ICD-10-CM

## 2023-08-30 NOTE — Therapy (Addendum)
OUTPATIENT PHYSICAL THERAPY TREATMENT   Patient Name: Keith York MRN: [id number] DOB:15-Sep-1971, 52 y.o., male Today's Date: 08/30/2023  END OF SESSION:  PT End of Session - 08/30/23 0804     Visit Number 7    Number of Visits 17    Date for PT Re-Evaluation 09/20/23    Authorization Type Eval: 07/26/2023    PT Start Time 0801    PT Stop Time 0840    PT Time Calculation (min) 39 min    Activity Tolerance Patient tolerated treatment well;No increased pain    Behavior During Therapy [facility] for tasks assessed/performed            Past Medical History:  Diagnosis Date   Allergy    Family history of colonic polyps    Family history of Lynch syndrome    Sinus congestion    Past Surgical History:  Procedure Laterality Date   EPIGASTRIC HERNIA REPAIR N/A 04/29/2021   Procedure: HERNIA REPAIR EPIGASTRIC ADULT;  Surgeon: Earline Mayotte, MD;  Location: [facility] ORS;  Service: General;  Laterality: N/A;   WISDOM TOOTH EXTRACTION  1995   Patient Active Problem List   Diagnosis Date Noted   Genetic testing 01/01/2021   Family history of colonic polyps    Family history of Lynch syndrome    Epigastric hernia 02/18/2016    PCP: Patient, No Pcp Per  REFERRING PROVIDER: Ronne Binning, NP  REFERRING DIAG: [phone] (ICD-10-CM) - Pain in left hip   RATIONALE FOR EVALUATION AND TREATMENT: Rehabilitation  THERAPY DIAG: Pain in left hip  Muscle weakness (generalized)  ONSET DATE: June 1st 2024  FOLLOW-UP APPT SCHEDULED WITH REFERRING PROVIDER: Yes   FROM INITIAL EVALUATION SUBJECTIVE:                                                                                                                                                                                          SUBJECTIVE STATEMENT:  Left Hip Pain    PERTINENT HISTORY:    07/26/2023:  Patient reports to PT with left hip pain following a fall in June. He reports "he turned a corner, fell, and landed on his hip."  Since the fall patient reports intermittent pain from posterolateral hip towards to the foot. Patient reports aggravation in pain when riding in smaller cars. He reports loss of sleep due to pain and is unable to rest a total of > 5 hours due to onset of pain throughout the night. Minor relief for two weeks after Prednisone taper however pain returned. Patient reports monthly "spinal adjustments" with chiropractor with minimal relief of hip pain. Currently he denies saddle  paraesthesia, fevers, chills, or loss of b/b.    PAIN:    Pain Intensity: Present: 0/10, Best: 0/10, Worst: 5/10 Pain location: Lateral left hip  Pain Quality: intermittent, sharp, and aching  Radiating: Yes, down LLE Numbness/Tingling: Yes Focal Weakness: No Aggravating factors: Prolonged sitting or supine positions Relieving factors: Walking, "Moving around"   24-hour pain behavior: AM: No Pain noted, PM: Pain throughout sleep  History of prior back or hip injury, pain, surgery, or therapy: Yes, history of back pain; Dominant hand: right Imaging: Yes, pt reports normal plain film radiographs; Red flags: Negative for bowel/bladder changes, saddle paresthesia, testicular pain, h/o kidney stones, h/o spinal tumors, h/o compression fx, personal history of cancer/malignancy (especially prostate or pelvic), acute hip trauma, night pain, h/o abdominal aneurysm, abdominal pain, chills/fever, night sweats, nausea, vomiting, diarrhea, unexplained weight gain/loss;   PRECAUTIONS: None   WEIGHT BEARING RESTRICTIONS: No   FALLS: Has patient fallen in last 6 months? Yes. 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      OBJECTIVE:   Patient Surveys  FOTO: 84, predicted 81 HAGOS: HAGOS Score: 77.7%    Cognition Patient is oriented to person, place, and time.  Recent memory is intact.  Remote memory is intact.  Attention span and concentration are intact.  Expressive speech is intact.  Patient's fund of knowledge is within normal limits for educational level.                      Gross Musculoskeletal Assessment Tremor: None; Bulk: Normal, no muscle wasting noted; Tone: Notable increased tension in left lumbar paraspinals ; No visible step-off along spinal column, no signs of scoliosis, no gross anatomical hip deformities; No erythema, edema, or ecchymosis noted;   GAIT: (Deferred)   Posture: Lumbar lordosis: WNL Iliac crest height: Equal bilaterally Lumbar lateral shift: Negative Leg length: Equal bilaterally;   AROM     AROM (Normal range in degrees) AROM   Lumbar    Flexion (65) WNL   Extension (30) WNL  Right lateral flexion (25)    Left lateral flexion (25)    Right rotation (30)    Left rotation (30)           Hip Right Left  Flexion (125) WNL WNL  Extension (15)      Abduction (40)      Adduction (30)      Internal Rotation (45) WNL WNL  External Rotation (45) WNL WNL         Knee      Flexion (135) WNL WNL  Extension (0) WNL WNL         Ankle      Dorsiflexion (20) WNL WNL  Plantarflexion (50) WNL WNL  (* = pain; Blank rows = not tested)   LE MMT: MMT (out of 5) Right   Left    Hip flexion 4 4  Hip extension      Hip abduction 4 4  Hip adduction      Hip internal rotation 4+ 4+  Hip external rotation 4+ 4+  Knee flexion 5 5  Knee extension 5 5  Ankle dorsiflexion 5 5  Ankle plantarflexion 5 5  Ankle inversion      Ankle eversion      (* = pain; Blank rows = not tested)   Sensation Deferred   Reflexes Deferred    Muscle Length Hamstrings: R: Negative L: Positive for tightness and concordant pain (30  knee flexion)  Ely (quadriceps): R: Not done L: Not done Thomas (hip flexors): R: Not done L: Not done Ober: R: Not done L: Not done   Palpation Location Right Left         Lumbar paraspinals 0 1  Quadratus Lumborum 0 0  Iliac Crest      ASIS 0 0  Pubic Rami      Pubic symphysis    Femoral Triangle      Inguinal ligament      Gluteus Maximus 0 0  Gluteus Medius 0 1  Deep hip external rotators 0 2  Sacrum    PSIS      Fortin's Area (SIJ) 0 0  Coccyx    Ischial Tuberosity 0 0  Greater Trochanter 0 0  (Blank rows = not tested) Graded on 0-4 scale (0 = no pain, 1 = pain, 2 = pain with wincing/grimacing/flinching, 3 = pain with withdrawal, 4 = unwilling to allow palpation)   Passive Accessory Intervertebral Motion Pt denies reproduction of hip pain with CPA L1-L5. UPA at L4 segment with report of concordant pain with radicular symptoms down LLE. Reduction of pain with repeated prone press up.    Special Tests Lumbar Radiculopathy and Discogenic: Centralization and Peripheralization (SN 92, -LR 0.12): Not done Slump (SN 83, -LR 0.32): R: Not done L: Not done SLR (SN 92, -LR 0.29): R: Negative L:  Negative Crossed SLR (SP 90): R: Negative L: Negative   Facet Joint: Extension-Rotation (SN 100, -LR 0.0): R: Not done L: Not done   Lumbar Foraminal Stenosis: Lumbar quadrant (SN 70): R: Not done L: Not done   Hip: FABER (SN 81): R: Negative L: Negative FADIR (SN 94): R: Reported concordant pain L: Negative Hip scour (SN 50): R: Negative L: Negative   SIJ:  Thigh Thrust (SN 88, -LR 0.18) : R: Negative L: Negative  Piriformis Syndrome: FAIR Test (SN 88, SP 83): R: Not done L: Not done  Physical Performance Measures (Deferred):  TODAY'S TREATMENT:   Subjective: Patient with report of dull ache since Saturday in the left hip. Denies radiating pain and lower back. No further questions or concerns.    Pain: 3-4/10 in left hip   Manual Therapy: Hypervolt to glute medius  and piriformis x 5 min; Extensive moderate STM to deep gluteals, myofascial release techniques utilized;  Manual Supine Piriformis Stretch 30s/bout x 3;  Manual Gluteal Stretch 30s/bout x 2;    Ther-Ex: NuStep Level 0-2  Lateral Banded Walks with Black TB around thigh in agility ladder 2 bouts x 56 ft  R Sidelying Hip Abduction against manual resistance 2 x 12  R Sidelying Clamshell with Black TB around thigh 2 x 12  Lateral Step Down 2 x 12 Single UE support;   Seated Piriformis Stretch 30s/bout x 2 bouts  Education on Seated Piriformis Stretch with good return demonstration    PATIENT EDUCATION:  Education details: tactile/verbal cues for core stabilizaton on bird bogs   Person educated: Patient Education method: Programmer, multimedia, Facilities manager, and Handouts Education comprehension: verbalized understanding   HOME EXERCISE PROGRAM:  Access Code: [email or web address] URL: [email or web address] Date:  07/27/2023 Prepared by: Ria Comment  Exercises - Supine Piriformis Stretch  - 1 x daily - 7 x weekly - 3 sets - 10 reps - Seated Piriformis Stretch with Trunk Bend  - 1 x daily - 7 x weekly - 3 sets - 10 reps - Standing Lumbar Extension  - 6 x daily - 7 x weekly - 2 sets - 10 reps - 3s hold  ASSESSMENT:  CLINICAL IMPRESSION: Today's session with a main focus on continued progression of gluteal strengthening. PT performed STM on deep gluteals with good reponse from patient and improved pain. PT reinforced piriformis stretch in different positions to prevent future tension. He is tolerating increased resistance with abduction exercises. Based on today's performance pt will benefit from PT services to address deficits in strength, balance, and mobility in order to return to full function at home.   OBJECTIVE IMPAIRMENTS: decreased strength, increased muscle spasms, impaired sensation, and pain.   ACTIVITY LIMITATIONS: sitting and sleeping  PARTICIPATION LIMITATIONS: driving,  occupation, and yard work  PERSONAL FACTORS: Age, Behavior pattern, and Profession are also affecting patient's functional outcome.   REHAB POTENTIAL: Excellent  CLINICAL DECISION MAKING: Evolving/moderate complexity  EVALUATION COMPLEXITY: Moderate   GOALS: Goals reviewed with patient? No  SHORT TERM GOALS: Target date: 08/23/2023   Pt will be independent with HEP in order to improve strength and decrease hip pain to improve pain-free function at home and work. Baseline:  Goal status: INITIAL   LONG TERM GOALS: Target date: 09/20/2023  Pt will increase FOTO to at least 81 to demonstrate significant improvement in function at home and work related to hip pain.  Baseline: 07/26/2023: 73; 08/25/23: 85 Goal status: ACHEIVED  2.  Pt will decrease worst hip pain by at least 3 points on the NPRS in order to demonstrate clinically significant reduction in hip pain. Baseline: 07/26/2023: 5/10; 08/25/23: 1/10 Goal status: ACHIEVED   3.  Pt will increase HAGOS score by at least 18 points in order demonstrate clinically significant reduction in hip pain/disability.       Baseline: 07/26/2023: 77% Goal status: INITIAL  4. Pt will report at least 5-6 hours of full rest without pain in order to demonstrate improvement in hip pain.  Baseline: 07/26/2023: n/a; 08/25/23: no pain during sleep at night  Goal status: ACHIEVED     PLAN: PT FREQUENCY: 1-2x/week  PT DURATION: 8 weeks  PLANNED INTERVENTIONS: Therapeutic exercises, Therapeutic activity, Neuromuscular re-education, Balance training, Gait training, Patient/Family education, Self Care, Joint mobilization, Joint manipulation, Vestibular training, Canalith repositioning, Orthotic/Fit training, DME instructions, Dry Needling, Electrical stimulation, Spinal manipulation, Spinal mobilization, Cryotherapy, Moist heat, Taping, Traction, Ultrasound, Ionotophoresis 4mg /ml Dexamethasone, Manual therapy, and Re-evaluation.  PLAN FOR NEXT  SESSION:  Dry Needling, Progressing Strength protocol.   Keith York, SPT Sharalyn Ink Huprich PT, DPT, GCS  08/30/2023 1:53 PM

## 2023-08-31 NOTE — Therapy (Signed)
OUTPATIENT PHYSICAL THERAPY TREATMENT   Patient Name: Keith York MRN: [id number] DOB:Jul 01, 1971, 52 y.o., male Today's Date: 09/01/2023  END OF SESSION:  PT End of Session - 09/01/23 0807     Visit Number 8    Number of Visits 17    Date for PT Re-Evaluation 09/20/23    Authorization Type Eval: 07/26/2023    PT Start Time 0800    PT Stop Time 0845    PT Time Calculation (min) 45 min    Activity Tolerance Patient tolerated treatment well;No increased pain    Behavior During Therapy [facility] for tasks assessed/performed            Past Medical History:  Diagnosis Date   Allergy    Family history of colonic polyps    Family history of Lynch syndrome    Sinus congestion    Past Surgical History:  Procedure Laterality Date   EPIGASTRIC HERNIA REPAIR N/A 04/29/2021   Procedure: HERNIA REPAIR EPIGASTRIC ADULT;  Surgeon: Earline Mayotte, MD;  Location: [facility] ORS;  Service: General;  Laterality: N/A;   WISDOM TOOTH EXTRACTION  1995   Patient Active Problem List   Diagnosis Date Noted   Genetic testing 01/01/2021   Family history of colonic polyps    Family history of Lynch syndrome    Epigastric hernia 02/18/2016    PCP: Patient, No Pcp Per  REFERRING PROVIDER: Ronne Binning, NP  REFERRING DIAG: [phone] (ICD-10-CM) - Pain in left hip   RATIONALE FOR EVALUATION AND TREATMENT: Rehabilitation  THERAPY DIAG: Pain in left hip  Muscle weakness (generalized)  ONSET DATE: June 1st 2024  FOLLOW-UP APPT SCHEDULED WITH REFERRING PROVIDER: Yes   FROM INITIAL EVALUATION SUBJECTIVE:                                                                                                                                                                                          SUBJECTIVE STATEMENT:  Left Hip Pain    PERTINENT HISTORY:    07/26/2023:  Patient reports to PT with left hip pain following a fall in June. He reports "he turned a corner, fell, and landed on his hip."  Since the fall patient reports intermittent pain from posterolateral hip towards to the foot. Patient reports aggravation in pain when riding in smaller cars. He reports loss of sleep due to pain and is unable to rest a total of > 5 hours due to onset of pain throughout the night. Minor relief for two weeks after Prednisone taper however pain returned. Patient reports monthly "spinal adjustments" with chiropractor with minimal relief of hip pain. Currently he denies saddle  paraesthesia, fevers, chills, or loss of b/b.    PAIN:    Pain Intensity: Present: 0/10, Best: 0/10, Worst: 5/10 Pain location: Lateral left hip  Pain Quality: intermittent, sharp, and aching  Radiating: Yes, down LLE Numbness/Tingling: Yes Focal Weakness: No Aggravating factors: Prolonged sitting or supine positions Relieving factors: Walking, "Moving around"   24-hour pain behavior: AM: No Pain noted, PM: Pain throughout sleep  History of prior back or hip injury, pain, surgery, or therapy: Yes, history of back pain; Dominant hand: right Imaging: Yes, pt reports normal plain film radiographs; Red flags: Negative for bowel/bladder changes, saddle paresthesia, testicular pain, h/o kidney stones, h/o spinal tumors, h/o compression fx, personal history of cancer/malignancy (especially prostate or pelvic), acute hip trauma, night pain, h/o abdominal aneurysm, abdominal pain, chills/fever, night sweats, nausea, vomiting, diarrhea, unexplained weight gain/loss;   PRECAUTIONS: None   WEIGHT BEARING RESTRICTIONS: No   FALLS: Has patient fallen in last 6 months? Yes. 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      OBJECTIVE:   Patient Surveys  FOTO: 76, predicted 81 HAGOS: HAGOS Score: 77.7%    Cognition Patient is oriented to person, place, and time.  Recent memory is intact.  Remote memory is intact.  Attention span and concentration are intact.  Expressive speech is intact.  Patient's fund of knowledge is within normal limits for educational level.                      Gross Musculoskeletal Assessment Tremor: None; Bulk: Normal, no muscle wasting noted; Tone: Notable increased tension in left lumbar paraspinals ; No visible step-off along spinal column, no signs of scoliosis, no gross anatomical hip deformities; No erythema, edema, or ecchymosis noted;   GAIT: (Deferred)   Posture: Lumbar lordosis: WNL Iliac crest height: Equal bilaterally Lumbar lateral shift: Negative Leg length: Equal bilaterally;   AROM     AROM (Normal range in degrees) AROM   Lumbar    Flexion (65) WNL   Extension (30) WNL  Right lateral flexion (25)    Left lateral flexion (25)    Right rotation (30)    Left rotation (30)           Hip Right Left  Flexion (125) WNL WNL  Extension (15)      Abduction (40)      Adduction (30)      Internal Rotation (45) WNL WNL  External Rotation (45) WNL WNL         Knee      Flexion (135) WNL WNL  Extension (0) WNL WNL         Ankle      Dorsiflexion (20) WNL WNL  Plantarflexion (50) WNL WNL  (* = pain; Blank rows = not tested)   LE MMT: MMT (out of 5) Right   Left    Hip flexion 4 4  Hip extension      Hip abduction 4 4  Hip adduction      Hip internal rotation 4+ 4+  Hip external rotation 4+ 4+  Knee flexion 5 5  Knee extension 5 5  Ankle dorsiflexion 5 5  Ankle plantarflexion 5 5  Ankle inversion      Ankle eversion      (* = pain; Blank rows = not tested)   Sensation Deferred   Reflexes Deferred    Muscle Length Hamstrings: R: Negative L: Positive for tightness and concordant pain (30  knee flexion)  Ely (quadriceps): R: Not done L: Not done Thomas (hip flexors): R: Not done L: Not done Ober: R: Not done L: Not done   Palpation Location Right Left         Lumbar paraspinals 0 1  Quadratus Lumborum 0 0  Iliac Crest      ASIS 0 0  Pubic Rami      Pubic symphysis    Femoral Triangle      Inguinal ligament      Gluteus Maximus 0 0  Gluteus Medius 0 1  Deep hip external rotators 0 2  Sacrum    PSIS      Fortin's Area (SIJ) 0 0  Coccyx    Ischial Tuberosity 0 0  Greater Trochanter 0 0  (Blank rows = not tested) Graded on 0-4 scale (0 = no pain, 1 = pain, 2 = pain with wincing/grimacing/flinching, 3 = pain with withdrawal, 4 = unwilling to allow palpation)   Passive Accessory Intervertebral Motion Pt denies reproduction of hip pain with CPA L1-L5. UPA at L4 segment with report of concordant pain with radicular symptoms down LLE. Reduction of pain with repeated prone press up.    Special Tests Lumbar Radiculopathy and Discogenic: Centralization and Peripheralization (SN 92, -LR 0.12): Not done Slump (SN 83, -LR 0.32): R: Not done L: Not done SLR (SN 92, -LR 0.29): R: Negative L:  Negative Crossed SLR (SP 90): R: Negative L: Negative   Facet Joint: Extension-Rotation (SN 100, -LR 0.0): R: Not done L: Not done   Lumbar Foraminal Stenosis: Lumbar quadrant (SN 70): R: Not done L: Not done   Hip: FABER (SN 81): R: Negative L: Negative FADIR (SN 94): R: Reported concordant pain L: Negative Hip scour (SN 50): R: Negative L: Negative   SIJ:  Thigh Thrust (SN 88, -LR 0.18) : R: Negative L: Negative  Piriformis Syndrome: FAIR Test (SN 88, SP 83): R: Not done L: Not done  Physical Performance Measures (Deferred):  TODAY'S TREATMENT:   Subjective: Patient reports doing well. No low back pain upon arrival.  No further questions or concerns.    Pain: 3-4/10 in left hip   Ther-Ex: NuStep Level 0-4 x 10 minutes during history for warm-up; Hooklying bridge  marches x 10 BLE; Hooklying single leg bridge hold x 10s BLE; Prone planks 20s hold x 3 (40s rest between reps); Pallof press 40# feet together 2 x 20 facing each direction; Resisted side stepping with blue tband around ankles 30' x 4;   Manual Therapy: Manual Supine L Piriformis Stretch x 30s; Gentle LLE long axis traction;   Trigger Point Dry Needling (TDN), unbilled Education previously performed with patient regarding potential benefit of TDN. Previously reviewed precautions and risks with patient. Extensive time spent with pt to ensure full understanding of TDN risks. Pt provided verbal consent to treatment. With pt in prone using clean technique TDN performed to L lumbar multifidi (L3 and L5) with 2, 0.30 x 75 single needle placements (one in each location) with deep ache and LLE symptoms reported. Pistoning technique utilized.     PATIENT EDUCATION:  Education  details: Pt educated throughout session about proper posture and technique with exercises. Improved exercise technique, movement at target joints, use of target muscles after min to mod verbal, visual, tactile cues.  Person educated: Patient Education method: Explanation, Demonstration, and Handouts Education comprehension: verbalized understanding   HOME EXERCISE PROGRAM:  Access Code: [email or web address] URL: [email or web address] Date: 09/01/2023 Prepared by: Ria Comment  Exercises - Supine Piriformis Stretch  - 1 x daily - 7 x weekly - 3 reps - 30s hold - Seated Piriformis Stretch with Trunk Bend  - 1 x daily - 7 x weekly - 3 reps - 30s hold - Supine Piriformis Stretch with Leg Straight  - 1 x daily - 7 x weekly - 3 reps - 30s hold - Prone Press Up  - 1 x daily - 7 x weekly - 2 sets - 12 reps - 5s hold - Dead Bug  - 1 x daily - 7 x weekly - 2-3 sets - 12 reps - Bird Dog  - 1 x daily - 7 x weekly - 2-3 sets - 12 reps - Standing Hip Abduction with Resistance at Ankles and Counter Support  - 1 x daily - 7 x  weekly - 2-3 sets - 12 reps - 2s hold  ASSESSMENT:  CLINICAL IMPRESSION: Today's session with a main focus on continued progression of strengthening. Repeated TDN and piriformis stretch. Updated HEP. Pt plans to follow-up in 2 weeks to check back in. Based on today's performance pt will benefit from PT services to address deficits in strength, balance, and mobility in order to return to full function at home.   OBJECTIVE IMPAIRMENTS: decreased strength, increased muscle spasms, impaired sensation, and pain.   ACTIVITY LIMITATIONS: sitting and sleeping  PARTICIPATION LIMITATIONS: driving, occupation, and yard work  PERSONAL FACTORS: Age, Behavior pattern, and Profession are also affecting patient's functional outcome.   REHAB POTENTIAL: Excellent  CLINICAL DECISION MAKING: Evolving/moderate complexity  EVALUATION COMPLEXITY: Moderate   GOALS: Goals reviewed with patient? No  SHORT TERM GOALS: Target date: 08/23/2023   Pt will be independent with HEP in order to improve strength and decrease hip pain to improve pain-free function at home and work. Baseline:  Goal status: INITIAL   LONG TERM GOALS: Target date: 09/20/2023  Pt will increase FOTO to at least 81 to demonstrate significant improvement in function at home and work related to hip pain.  Baseline: 07/26/2023: 73; 08/25/23: 85 Goal status: ACHEIVED  2.  Pt will decrease worst hip pain by at least 3 points on the NPRS in order to demonstrate clinically significant reduction in hip pain. Baseline: 07/26/2023: 5/10; 08/25/23: 1/10 Goal status: ACHIEVED   3.  Pt will increase HAGOS score by at least 18 points in order demonstrate clinically significant reduction in hip pain/disability.       Baseline: 07/26/2023: 77% Goal status: INITIAL  4. Pt will report at least 5-6 hours of full rest without pain in order to demonstrate improvement in hip pain.  Baseline: 07/26/2023: n/a; 08/25/23: no pain during sleep at night  Goal  status: ACHIEVED     PLAN: PT FREQUENCY: 1-2x/week  PT DURATION: 8 weeks  PLANNED INTERVENTIONS: Therapeutic exercises, Therapeutic activity, Neuromuscular re-education, Balance training, Gait training, Patient/Family education, Self Care, Joint mobilization, Joint manipulation, Vestibular training, Canalith repositioning, Orthotic/Fit training, DME instructions, Dry Needling, Electrical stimulation, Spinal manipulation, Spinal mobilization, Cryotherapy, Moist heat, Taping, Traction, Ultrasound, Ionotophoresis 4mg /ml Dexamethasone, Manual therapy, and Re-evaluation.  PLAN FOR NEXT SESSION:  Dry Needling as needed, Progressing  Strength protocol.    Sharalyn Ink Siyon Linck PT, DPT, GCS  09/01/2023 8:51 AM

## 2023-09-01 ENCOUNTER — Ambulatory Visit: Payer: Self-pay

## 2023-09-01 DIAGNOSIS — M25552 Pain in left hip: Secondary | ICD-10-CM

## 2023-09-01 DIAGNOSIS — M6281 Muscle weakness (generalized): Secondary | ICD-10-CM

## 2023-09-05 NOTE — Therapy (Signed)
OUTPATIENT PHYSICAL THERAPY TREATMENT   Patient Name: Keith York MRN: [id number] DOB:[birth year], 52 y.o., male Today's Date: 09/07/2023  END OF SESSION:  PT End of Session - 09/07/23 0850     Visit Number 9    Number of Visits 17    Date for PT Re-Evaluation 09/20/23    Authorization Type Eval: 07/26/2023    PT Start Time 0847    PT Stop Time 0930    PT Time Calculation (min) 43 min    Activity Tolerance Patient tolerated treatment well;No increased pain    Behavior During Therapy [facility] for tasks assessed/performed             Past Medical History:  Diagnosis Date   Allergy    Family history of colonic polyps    Family history of Lynch syndrome    Sinus congestion    Past Surgical History:  Procedure Laterality Date   EPIGASTRIC HERNIA REPAIR N/A 04/29/2021   Procedure: HERNIA REPAIR EPIGASTRIC ADULT;  Surgeon: Earline Mayotte, MD;  Location: [facility] ORS;  Service: General;  Laterality: N/A;   WISDOM TOOTH EXTRACTION  1995   Patient Active Problem List   Diagnosis Date Noted   Genetic testing 01/01/2021   Family history of colonic polyps    Family history of Lynch syndrome    Epigastric hernia 02/18/2016    PCP: Patient, No Pcp Per  REFERRING PROVIDER: Ronne Binning, NP  REFERRING DIAG: [phone] (ICD-10-CM) - Pain in left hip   RATIONALE FOR EVALUATION AND TREATMENT: Rehabilitation  THERAPY DIAG: Pain in left hip  Muscle weakness (generalized)  ONSET DATE: June 1st 2024  FOLLOW-UP APPT SCHEDULED WITH REFERRING PROVIDER: Yes   FROM INITIAL EVALUATION SUBJECTIVE:                                                                                                                                                                                          SUBJECTIVE STATEMENT:  Left Hip Pain    PERTINENT HISTORY:    07/26/2023:  Patient reports to PT with left hip pain following a fall in June. He reports "he turned a corner, fell, and landed on his hip."  Since the fall patient reports intermittent pain from posterolateral hip towards to the foot. Patient reports aggravation in pain when riding in smaller cars. He reports loss of sleep due to pain and is unable to rest a total of > 5 hours due to onset of pain throughout the night. Minor relief for two weeks after Prednisone taper however pain returned. Patient reports monthly "spinal adjustments" with chiropractor with minimal relief of hip pain. Currently he denies  saddle paraesthesia, fevers, chills, or loss of b/b.    PAIN:    Pain Intensity: Present: 0/10, Best: 0/10, Worst: 5/10 Pain location: Lateral left hip  Pain Quality: intermittent, sharp, and aching  Radiating: Yes, down LLE Numbness/Tingling: Yes Focal Weakness: No Aggravating factors: Prolonged sitting or supine positions Relieving factors: Walking, "Moving around"   24-hour pain behavior: AM: No Pain noted, PM: Pain throughout sleep  History of prior back or hip injury, pain, surgery, or therapy: Yes, history of back pain; Dominant hand: right Imaging: Yes, pt reports normal plain film radiographs; Red flags: Negative for bowel/bladder changes, saddle paresthesia, testicular pain, h/o kidney stones, h/o spinal tumors, h/o compression fx, personal history of cancer/malignancy (especially prostate or pelvic), acute hip trauma, night pain, h/o abdominal aneurysm, abdominal pain, chills/fever, night sweats, nausea, vomiting, diarrhea, unexplained weight gain/loss;   PRECAUTIONS: None   WEIGHT BEARING RESTRICTIONS: No   FALLS: Has patient fallen in last 6 months? Yes. 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      OBJECTIVE:   Patient Surveys  FOTO: 80, predicted 81 HAGOS: HAGOS Score: 77.7%    Cognition Patient is oriented to person, place, and time.  Recent memory is intact.  Remote memory is intact.  Attention span and concentration are intact.  Expressive speech is intact.  Patient's fund of knowledge is within normal limits for educational level.                      Gross Musculoskeletal Assessment Tremor: None; Bulk: Normal, no muscle wasting noted; Tone: Notable increased tension in left lumbar paraspinals ; No visible step-off along spinal column, no signs of scoliosis, no gross anatomical hip deformities; No erythema, edema, or ecchymosis noted;   GAIT: (Deferred)   Posture: Lumbar lordosis: WNL Iliac crest height: Equal bilaterally Lumbar lateral shift: Negative Leg length: Equal bilaterally;   AROM     AROM (Normal range in degrees) AROM   Lumbar    Flexion (65) WNL   Extension (30) WNL  Right lateral flexion (25)    Left lateral flexion (25)    Right rotation (30)    Left rotation (30)           Hip Right Left  Flexion (125) WNL WNL  Extension (15)      Abduction (40)      Adduction (30)      Internal Rotation (45) WNL WNL  External Rotation (45) WNL WNL         Knee      Flexion (135) WNL WNL  Extension (0) WNL WNL         Ankle      Dorsiflexion (20) WNL WNL  Plantarflexion (50) WNL WNL  (* = pain; Blank rows = not tested)   LE MMT: MMT (out of 5) Right   Left    Hip flexion 4 4  Hip extension      Hip abduction 4 4  Hip adduction      Hip internal rotation 4+ 4+  Hip external rotation 4+ 4+  Knee flexion 5  5  Knee extension 5 5  Ankle dorsiflexion 5 5  Ankle plantarflexion 5 5  Ankle inversion      Ankle eversion      (* = pain; Blank rows = not tested)   Sensation Deferred   Reflexes Deferred    Muscle Length Hamstrings: R: Negative L: Positive for tightness and concordant pain (30  knee flexion)  Ely (quadriceps): R: Not done L: Not done Thomas (hip flexors): R: Not done L: Not done Ober: R: Not done L: Not done   Palpation Location Right Left         Lumbar paraspinals 0 1  Quadratus Lumborum 0 0  Iliac Crest      ASIS 0 0  Pubic Rami      Pubic symphysis    Femoral Triangle      Inguinal ligament      Gluteus Maximus 0 0  Gluteus Medius 0 1  Deep hip external rotators 0 2  Sacrum    PSIS      Fortin's Area (SIJ) 0 0  Coccyx    Ischial Tuberosity 0 0  Greater Trochanter 0 0  (Blank rows = not tested) Graded on 0-4 scale (0 = no pain, 1 = pain, 2 = pain with wincing/grimacing/flinching, 3 = pain with withdrawal, 4 = unwilling to allow palpation)   Passive Accessory Intervertebral Motion Pt denies reproduction of hip pain with CPA L1-L5. UPA at L4 segment with report of concordant pain with radicular symptoms down LLE. Reduction of pain with repeated prone press up.    Special Tests Lumbar Radiculopathy and Discogenic: Centralization and Peripheralization (SN 92, -LR 0.12): Not done Slump (SN 83, -LR 0.32): R: Not done L: Not done SLR (SN 92, -LR 0.29): R: Negative L:  Negative Crossed SLR (SP 90): R: Negative L: Negative   Facet Joint: Extension-Rotation (SN 100, -LR 0.0): R: Not done L: Not done   Lumbar Foraminal Stenosis: Lumbar quadrant (SN 70): R: Not done L: Not done   Hip: FABER (SN 81): R: Negative L: Negative FADIR (SN 94): R: Reported concordant pain L: Negative Hip scour (SN 50): R: Negative L: Negative   SIJ:  Thigh Thrust (SN 88, -LR 0.18) : R: Negative L: Negative  Piriformis Syndrome: FAIR Test (SN 88, SP 83): R: Not done L: Not done  Physical Performance Measures (Deferred):   TODAY'S TREATMENT:   Subjective: Patient reports doing well. At least 90% improvement since starting with therapy. No low back pain upon arrival.  No further questions or concerns.    Pain: No hip pain    Ther-Ex: NuStep Level 0-4 x 10  minutes during history for warm-up; McGill curl up 10s hold x 10 (5 with each leg extended); Hooklying single leg bridge 10s hold x 5, RLE only (attempted LLE but limited due to cramping in hamstring) Pallof press 40# feet together 2 x 10 facing each direction;   Manual Therapy: Prone press up with overpressure from therapist at L4/5 x 10; Gentle LLE long axis traction;   Trigger Point Dry Needling (TDN) electrical stimulation Education previously performed with patient regarding potential benefit of TDN. Previously reviewed precautions and risks with patient. Extensive time spent with pt to ensure full understanding of TDN risks. Pt provided verbal consent to treatment. With pt in prone using clean technique TDN performed to L lumbar multifidi (L4 and L5) with 4, 0.30 x 75 single needle placements (one in each location on each side) with deep ache  and LLE symptoms reported. Attached leads on 2 channels with biphasic square wave with a negative spike; frequency of 2 Hz and intensity of 1 mA (pt tolerable limit with visible motor response) x 4 minutes. Transitioned to microA at same intensity but increased the frequency to 100Hz .      PATIENT EDUCATION:  Education details: Pt educated throughout session about proper posture and technique with exercises. Improved exercise technique, movement at target joints, use of target muscles after min to mod verbal, visual, tactile cues.  Person educated: Patient Education method: Explanation, Demonstration, and Handouts Education comprehension: verbalized understanding   HOME EXERCISE PROGRAM:  Access Code: [email or web address] URL: [email or web address] Date: 09/01/2023 Prepared by: Ria Comment  Exercises - Supine Piriformis Stretch  - 1 x daily - 7 x weekly - 3 reps - 30s hold - Seated Piriformis Stretch with Trunk Bend  - 1 x daily - 7 x weekly - 3 reps - 30s hold - Supine Piriformis Stretch with Leg Straight  - 1 x daily - 7 x weekly - 3  reps - 30s hold - Prone Press Up  - 1 x daily - 7 x weekly - 2 sets - 12 reps - 5s hold - Dead Bug  - 1 x daily - 7 x weekly - 2-3 sets - 12 reps - Bird Dog  - 1 x daily - 7 x weekly - 2-3 sets - 12 reps - Standing Hip Abduction with Resistance at Ankles and Counter Support  - 1 x daily - 7 x weekly - 2-3 sets - 12 reps - 2s hold  ASSESSMENT:  CLINICAL IMPRESSION: Today's session with a main focus on continued progression of strengthening. He reports approximately 90% improvement in symptoms since starting with therapy. Introduced TDN electrical stimulation and pt has a positive motor response. No HEP modifications on this date. Pt plans to follow-up in 2 weeks to check back in with therapy. He would like to consider an additional steroid taper so recommended he reach out to PCP. Based on today's performance pt will benefit from PT services to address deficits in strength, balance, and mobility in order to return to full function at home.   OBJECTIVE IMPAIRMENTS: decreased strength, increased muscle spasms, impaired sensation, and pain.   ACTIVITY LIMITATIONS: sitting and sleeping  PARTICIPATION LIMITATIONS: driving, occupation, and yard work  PERSONAL FACTORS: Age, Behavior pattern, and Profession are also affecting patient's functional outcome.   REHAB POTENTIAL: Excellent  CLINICAL DECISION MAKING: Evolving/moderate complexity  EVALUATION COMPLEXITY: Moderate   GOALS: Goals reviewed with patient? No  SHORT TERM GOALS: Target date: 08/23/2023   Pt will be independent with HEP in order to improve strength and decrease hip pain to improve pain-free function at home and work. Baseline:  Goal status: INITIAL   LONG TERM GOALS: Target date: 09/20/2023  Pt will increase FOTO to at least 81 to demonstrate significant improvement in function at home and work related to hip pain.  Baseline: 07/26/2023: 73; 08/25/23: 85 Goal status: ACHEIVED  2.  Pt will decrease worst hip pain by at  least 3 points on the NPRS in order to demonstrate clinically significant reduction in hip pain. Baseline: 07/26/2023: 5/10; 08/25/23: 1/10 Goal status: ACHIEVED   3.  Pt will increase HAGOS score by at least 18 points in order demonstrate clinically significant reduction in hip pain/disability.       Baseline: 07/26/2023: 77% Goal status: INITIAL  4. Pt will report at least 5-6 hours of full rest without pain in  order to demonstrate improvement in hip pain.  Baseline: 07/26/2023: n/a; 08/25/23: no pain during sleep at night  Goal status: ACHIEVED     PLAN: PT FREQUENCY: 1-2x/week  PT DURATION: 8 weeks  PLANNED INTERVENTIONS: Therapeutic exercises, Therapeutic activity, Neuromuscular re-education, Balance training, Gait training, Patient/Family education, Self Care, Joint mobilization, Joint manipulation, Vestibular training, Canalith repositioning, Orthotic/Fit training, DME instructions, Dry Needling, Electrical stimulation, Spinal manipulation, Spinal mobilization, Cryotherapy, Moist heat, Taping, Traction, Ultrasound, Ionotophoresis 4mg /ml Dexamethasone, Manual therapy, and Re-evaluation.  PLAN FOR NEXT SESSION:  Progress note, TDN as needed, Progressing Strength protocol.    Sharalyn Ink Yoshi Mancillas PT, DPT, GCS  09/07/2023 10:12 AM

## 2023-09-07 ENCOUNTER — Ambulatory Visit: Payer: Self-pay

## 2023-09-07 DIAGNOSIS — M6281 Muscle weakness (generalized): Secondary | ICD-10-CM

## 2023-09-07 DIAGNOSIS — M25552 Pain in left hip: Secondary | ICD-10-CM

## 2023-09-18 NOTE — Therapy (Incomplete)
OUTPATIENT PHYSICAL THERAPY TREATMENT/PROGRESS NOTE  Dates of reporting period  08/11/23   to   09/20/23    Patient Name: Keith York MRN: [id number] DOB:[birth year], 52 y.o., male Today's Date: 09/18/2023  END OF SESSION:    Past Medical History:  Diagnosis Date   Allergy    Family history of colonic polyps    Family history of Lynch syndrome    Sinus congestion    Past Surgical History:  Procedure Laterality Date   EPIGASTRIC HERNIA REPAIR N/A 04/29/2021   Procedure: HERNIA REPAIR EPIGASTRIC ADULT;  Surgeon: Earline Mayotte, MD;  Location: [facility] ORS;  Service: General;  Laterality: N/A;   WISDOM TOOTH EXTRACTION  1995   Patient Active Problem List   Diagnosis Date Noted   Genetic testing 01/01/2021   Family history of colonic polyps    Family history of Lynch syndrome    Epigastric hernia 02/18/2016    PCP: Patient, No Pcp Per  REFERRING PROVIDER: Ronne Binning, NP  REFERRING DIAG: [phone] (ICD-10-CM) - Pain in left hip   RATIONALE FOR EVALUATION AND TREATMENT: Rehabilitation  THERAPY DIAG: Pain in left hip  Muscle weakness (generalized)  ONSET DATE: June 1st 2024  FOLLOW-UP APPT SCHEDULED WITH REFERRING PROVIDER: Yes   FROM INITIAL EVALUATION SUBJECTIVE:                                                                                                                                                                                          SUBJECTIVE STATEMENT:  Left Hip Pain    PERTINENT HISTORY:    07/26/2023:  Patient reports to PT with left hip pain following a fall in June. He reports "he turned a corner, fell, and landed on his hip." Since the fall patient reports intermittent pain from posterolateral hip towards to the foot. Patient reports aggravation in pain when riding in smaller cars. He reports loss of sleep due to pain and is unable to rest a total of > 5 hours due to onset of pain throughout the night. Minor relief for two weeks after  Prednisone taper however pain returned. Patient reports monthly "spinal adjustments" with chiropractor with minimal relief of hip pain. Currently he denies saddle paraesthesia, fevers, chills, or loss of b/b.    PAIN:    Pain Intensity: Present: 0/10, Best: 0/10, Worst: 5/10 Pain location: Lateral left hip  Pain Quality: intermittent, sharp, and aching  Radiating: Yes, down LLE Numbness/Tingling: Yes Focal Weakness: No Aggravating factors: Prolonged sitting or supine positions Relieving factors: Walking, "Moving around"   24-hour pain behavior: AM: No Pain noted, PM: Pain throughout sleep  History of prior back  or hip injury, pain, surgery, or therapy: Yes, history of back pain; Dominant hand: right Imaging: Yes, pt reports normal plain film radiographs; Red flags: Negative for bowel/bladder changes, saddle paresthesia, testicular pain, h/o kidney stones, h/o spinal tumors, h/o compression fx, personal history of cancer/malignancy (especially prostate or pelvic), acute hip trauma, night pain, h/o abdominal aneurysm, abdominal pain, chills/fever, night sweats, nausea, vomiting, diarrhea, unexplained weight gain/loss;   PRECAUTIONS: None   WEIGHT BEARING RESTRICTIONS: No   FALLS: Has patient fallen in last 6 months? Yes. 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      OBJECTIVE:   Patient Surveys  FOTO: 63, predicted 81 HAGOS: HAGOS Score: 77.7%    Cognition Patient is oriented to person, place, and time.  Recent memory is intact.  Remote memory is intact.  Attention span and concentration are intact.  Expressive speech is  intact.  Patient's fund of knowledge is within normal limits for educational level.                      Gross Musculoskeletal Assessment Tremor: None; Bulk: Normal, no muscle wasting noted; Tone: Notable increased tension in left lumbar paraspinals ; No visible step-off along spinal column, no signs of scoliosis, no gross anatomical hip deformities; No erythema, edema, or ecchymosis noted;   GAIT: (Deferred)   Posture: Lumbar lordosis: WNL Iliac crest height: Equal bilaterally Lumbar lateral shift: Negative Leg length: Equal bilaterally;   AROM     AROM (Normal range in degrees) AROM   Lumbar    Flexion (65) WNL   Extension (30) WNL  Right lateral flexion (25)    Left lateral flexion (25)    Right rotation (30)    Left rotation (30)           Hip Right Left  Flexion (125) WNL WNL  Extension (15)      Abduction (40)      Adduction (30)      Internal Rotation (45) WNL WNL  External Rotation (45) WNL WNL         Knee      Flexion (135) WNL WNL  Extension (0) WNL WNL         Ankle      Dorsiflexion (20) WNL WNL  Plantarflexion (50) WNL WNL  (* = pain; Blank rows = not tested)   LE MMT: MMT (out of 5) Right   Left    Hip flexion 4 4  Hip extension      Hip abduction 4 4  Hip adduction      Hip internal rotation 4+ 4+  Hip external rotation 4+ 4+  Knee flexion 5 5  Knee extension 5 5  Ankle dorsiflexion 5 5  Ankle plantarflexion 5 5  Ankle inversion      Ankle eversion      (* = pain; Blank rows = not tested)   Sensation Deferred   Reflexes Deferred    Muscle Length Hamstrings: R: Negative L: Positive for tightness and concordant pain (30 knee flexion)  Ely (quadriceps): R: Not done L: Not done USAA (  hip flexors): R: Not done L: Not done Ober: R: Not done L: Not done   Palpation Location Right Left         Lumbar paraspinals 0 1  Quadratus Lumborum 0 0  Iliac Crest      ASIS 0 0  Pubic Rami      Pubic symphysis    Femoral Triangle       Inguinal ligament      Gluteus Maximus 0 0  Gluteus Medius 0 1  Deep hip external rotators 0 2  Sacrum    PSIS      Fortin's Area (SIJ) 0 0  Coccyx    Ischial Tuberosity 0 0  Greater Trochanter 0 0  (Blank rows = not tested) Graded on 0-4 scale (0 = no pain, 1 = pain, 2 = pain with wincing/grimacing/flinching, 3 = pain with withdrawal, 4 = unwilling to allow palpation)   Passive Accessory Intervertebral Motion Pt denies reproduction of hip pain with CPA L1-L5. UPA at L4 segment with report of concordant pain with radicular symptoms down LLE. Reduction of pain with repeated prone press up.    Special Tests Lumbar Radiculopathy and Discogenic: Centralization and Peripheralization (SN 92, -LR 0.12): Not done Slump (SN 83, -LR 0.32): R: Not done L: Not done SLR (SN 92, -LR 0.29): R: Negative L:  Negative Crossed SLR (SP 90): R: Negative L: Negative   Facet Joint: Extension-Rotation (SN 100, -LR 0.0): R: Not done L: Not done   Lumbar Foraminal Stenosis: Lumbar quadrant (SN 70): R: Not done L: Not done   Hip: FABER (SN 81): R: Negative L: Negative FADIR (SN 94): R: Reported concordant pain L: Negative Hip scour (SN 50): R: Negative L: Negative   SIJ:  Thigh Thrust (SN 88, -LR 0.18) : R: Negative L: Negative  Piriformis Syndrome: FAIR Test (SN 88, SP 83): R: Not done L: Not done  Physical Performance Measures (Deferred):   TODAY'S TREATMENT:   Subjective: Patient reports doing well. At least 90% improvement since starting with therapy. No low back pain upon arrival.  No further questions or concerns.    Pain: No hip pain    Ther-Ex: NuStep Level 0-4 x 10 minutes during history for warm-up; Updated Outcome Measures: FOTO:  HAGOS: Worst pain:  McGill curl up 10s hold x 10 (5 with each leg extended); Hooklying single leg bridge 10s hold x 5, RLE only (attempted LLE but limited due to cramping in hamstring) Pallof press 40# feet together 2 x 10 facing each  direction;   Manual Therapy: Prone press up with overpressure from therapist at L4/5 x 10; Gentle LLE long axis traction;   Trigger Point Dry Needling (TDN) electrical stimulation Education previously performed with patient regarding potential benefit of TDN. Previously reviewed precautions and risks with patient. Extensive time spent with pt to ensure full understanding of TDN risks. Pt provided verbal consent to treatment. With pt in prone using clean technique TDN performed to L lumbar multifidi (L4 and L5) with 4, 0.30 x 75 single needle placements (one in each location on each side) with deep ache and LLE symptoms reported. Attached leads on 2 channels with biphasic square wave with a negative spike; frequency of 2 Hz and intensity of 1 mA (pt tolerable limit with visible motor response) x 4 minutes. Transitioned to microA at same intensity but increased the frequency to 100Hz .      PATIENT EDUCATION:  Education details: Pt educated throughout session about proper posture and  technique with exercises. Improved exercise technique, movement at target joints, use of target muscles after min to mod verbal, visual, tactile cues.  Person educated: Patient Education method: Explanation, Demonstration, and Handouts Education comprehension: verbalized understanding   HOME EXERCISE PROGRAM:  Access Code: [email or web address] URL: [email or web address] Date: 09/01/2023 Prepared by: Ria Comment  Exercises - Supine Piriformis Stretch  - 1 x daily - 7 x weekly - 3 reps - 30s hold - Seated Piriformis Stretch with Trunk Bend  - 1 x daily - 7 x weekly - 3 reps - 30s hold - Supine Piriformis Stretch with Leg Straight  - 1 x daily - 7 x weekly - 3 reps - 30s hold - Prone Press Up  - 1 x daily - 7 x weekly - 2 sets - 12 reps - 5s hold - Dead Bug  - 1 x daily - 7 x weekly - 2-3 sets - 12 reps - Bird Dog  - 1 x daily - 7 x weekly - 2-3 sets - 12 reps - Standing Hip Abduction with Resistance at  Ankles and Counter Support  - 1 x daily - 7 x weekly - 2-3 sets - 12 reps - 2s hold  ASSESSMENT:  CLINICAL IMPRESSION: Updated outcome measures during visit today. Today's session with a main focus on continued progression of strengthening. He reports approximately 90% improvement in symptoms since starting with therapy. Introduced TDN electrical stimulation and pt has a positive motor response. No HEP modifications on this date. Pt plans to follow-up in 2 weeks to check back in with therapy. He would like to consider an additional steroid taper so recommended he reach out to PCP. Based on today's performance pt will benefit from PT services to address deficits in strength, balance, and mobility in order to return to full function at home.   OBJECTIVE IMPAIRMENTS: decreased strength, increased muscle spasms, impaired sensation, and pain.   ACTIVITY LIMITATIONS: sitting and sleeping  PARTICIPATION LIMITATIONS: driving, occupation, and yard work  PERSONAL FACTORS: Age, Behavior pattern, and Profession are also affecting patient's functional outcome.   REHAB POTENTIAL: Excellent  CLINICAL DECISION MAKING: Evolving/moderate complexity  EVALUATION COMPLEXITY: Moderate   GOALS: Goals reviewed with patient? No  SHORT TERM GOALS: Target date: 08/23/2023   Pt will be independent with HEP in order to improve strength and decrease hip pain to improve pain-free function at home and work. Baseline:  Goal status: INITIAL   LONG TERM GOALS: Target date: 09/20/2023  Pt will increase FOTO to at least 81 to demonstrate significant improvement in function at home and work related to hip pain.  Baseline: 07/26/2023: 73; 08/25/23: 85 Goal status: ACHEIVED  2.  Pt will decrease worst hip pain by at least 3 points on the NPRS in order to demonstrate clinically significant reduction in hip pain. Baseline: 07/26/2023: 5/10; 08/25/23: 1/10 Goal status: ACHIEVED   3.  Pt will increase HAGOS score by at  least 18 points in order demonstrate clinically significant reduction in hip pain/disability.       Baseline: 07/26/2023: 77% Goal status: INITIAL  4. Pt will report at least 5-6 hours of full rest without pain in order to demonstrate improvement in hip pain.  Baseline: 07/26/2023: n/a; 08/25/23: no pain during sleep at night  Goal status: ACHIEVED     PLAN: PT FREQUENCY: 1-2x/week  PT DURATION: 8 weeks  PLANNED INTERVENTIONS: Therapeutic exercises, Therapeutic activity, Neuromuscular re-education, Balance training, Gait training, Patient/Family education, Self Care, Joint mobilization, Joint manipulation, Vestibular training, Canalith repositioning,  Orthotic/Fit training, DME instructions, Dry Needling, Electrical stimulation, Spinal manipulation, Spinal mobilization, Cryotherapy, Moist heat, Taping, Traction, Ultrasound, Ionotophoresis 4mg /ml Dexamethasone, Manual therapy, and Re-evaluation.  PLAN FOR NEXT SESSION:  Progress note, TDN as needed, Progressing Strength protocol.    Sharalyn Ink Hydie Langan PT, DPT, GCS  09/18/2023 9:32 PM

## 2023-09-20 ENCOUNTER — Ambulatory Visit: Payer: Self-pay | Attending: Family

## 2023-09-20 DIAGNOSIS — M6281 Muscle weakness (generalized): Secondary | ICD-10-CM

## 2023-09-20 DIAGNOSIS — M25552 Pain in left hip: Secondary | ICD-10-CM

## 2023-12-07 ENCOUNTER — Emergency Department: Payer: Self-pay

## 2023-12-07 ENCOUNTER — Emergency Department
Admission: EM | Admit: 2023-12-07 | Discharge: 2023-12-07 | Disposition: A | Discharge status: Home / Self Care | Payer: Self-pay | Attending: Emergency Medicine | Admitting: Emergency Medicine

## 2023-12-07 ENCOUNTER — Encounter: Payer: Self-pay | Admitting: Emergency Medicine

## 2023-12-07 ENCOUNTER — Other Ambulatory Visit: Payer: Self-pay

## 2023-12-07 DIAGNOSIS — R42 Dizziness and giddiness: Secondary | ICD-10-CM

## 2023-12-07 DIAGNOSIS — U071 COVID-19: Secondary | ICD-10-CM | POA: Insufficient documentation

## 2023-12-07 DIAGNOSIS — R55 Syncope and collapse: Secondary | ICD-10-CM | POA: Insufficient documentation

## 2023-12-07 DIAGNOSIS — I10 Essential (primary) hypertension: Secondary | ICD-10-CM | POA: Insufficient documentation

## 2023-12-07 DIAGNOSIS — E876 Hypokalemia: Secondary | ICD-10-CM | POA: Insufficient documentation

## 2023-12-07 LAB — BASIC METABOLIC PANEL
Anion gap: 14 (ref 5–15)
BUN: 12 mg/dL (ref 6–20)
CO2: 21 mmol/L — ABNORMAL LOW (ref 22–32)
Calcium: 9.4 mg/dL (ref 8.9–10.3)
Chloride: 100 mmol/L (ref 98–111)
Creatinine, Ser: 0.9 mg/dL (ref 0.61–1.24)
GFR, Estimated: >60 mL/min (ref >60)
Glucose, Bld: 134 mg/dL — ABNORMAL HIGH (ref 70–99)
Potassium: 3.1 mmol/L — ABNORMAL LOW (ref 3.5–5.1)
Sodium: 135 mmol/L (ref 135–145)

## 2023-12-07 LAB — CBC
HCT: 45.1 % (ref 39.0–52.0)
Hemoglobin: 15.8 g/dL (ref 13.0–17.0)
MCH: 31.8 pg (ref 26.0–34.0)
MCHC: 35 g/dL (ref 30.0–36.0)
MCV: 90.7 fL (ref 80.0–100.0)
Platelets: 238 10*3/uL (ref 150–400)
RBC: 4.97 MIL/uL (ref 4.22–5.81)
RDW: 12.4 % (ref 11.5–15.5)
WBC: 5.5 10*3/uL (ref 4.0–10.5)
nRBC: 0 % (ref 0.0–0.2)

## 2023-12-07 LAB — RESP PANEL BY RT-PCR (RSV, FLU A&B, COVID)  RVPGX2
Influenza A by PCR: NEGATIVE
Influenza B by PCR: NEGATIVE
Resp Syncytial Virus by PCR: NEGATIVE
SARS Coronavirus 2 by RT PCR: POSITIVE — AB

## 2023-12-07 LAB — LIPASE, BLOOD: Lipase: 28 U/L (ref 11–51)

## 2023-12-07 LAB — HEPATIC FUNCTION PANEL
ALT: 76 U/L — ABNORMAL HIGH (ref 0–44)
AST: 37 U/L (ref 15–41)
Albumin: 4.4 g/dL (ref 3.5–5.0)
Alkaline Phosphatase: 51 U/L (ref 38–126)
Bilirubin, Direct: <0.1 mg/dL (ref 0.0–0.2)
Total Bilirubin: 0.6 mg/dL (ref 0.0–1.2)
Total Protein: 7.6 g/dL (ref 6.5–8.1)

## 2023-12-07 LAB — TROPONIN I (HIGH SENSITIVITY)
Troponin I (High Sensitivity): 7 ng/L (ref <18)
Troponin I (High Sensitivity): 8 ng/L (ref <18)

## 2023-12-07 LAB — TSH: TSH: 1.965 u[IU]/mL (ref 0.350–4.500)

## 2023-12-07 MED ORDER — FAMOTIDINE IN NACL 20-0.9 MG/50ML-% IV SOLN
20.0000 mg | Freq: Once | INTRAVENOUS | Status: AC
  Administered 2023-12-07: 20 mg via INTRAVENOUS
  Filled 2023-12-07: qty 50

## 2023-12-07 MED ORDER — HYDRALAZINE HCL 20 MG/ML IJ SOLN
5.0000 mg | Freq: Once | INTRAMUSCULAR | Status: AC
  Administered 2023-12-07: 5 mg via INTRAVENOUS
  Filled 2023-12-07: qty 1

## 2023-12-07 MED ORDER — POTASSIUM CHLORIDE CRYS ER 20 MEQ PO TBCR
40.0000 meq | EXTENDED_RELEASE_TABLET | Freq: Once | ORAL | Status: AC
  Administered 2023-12-07: 40 meq via ORAL
  Filled 2023-12-07: qty 2

## 2023-12-07 MED ORDER — SODIUM CHLORIDE 0.9 % IV BOLUS
1000.0000 mL | Freq: Once | INTRAVENOUS | Status: AC
  Administered 2023-12-07: 1000 mL via INTRAVENOUS

## 2023-12-07 MED ORDER — ONDANSETRON HCL 4 MG/2ML IJ SOLN
4.0000 mg | Freq: Once | INTRAMUSCULAR | Status: AC
  Administered 2023-12-07: 4 mg via INTRAVENOUS
  Filled 2023-12-07: qty 2

## 2023-12-07 NOTE — ED Notes (Signed)
Pt to radiology at this time. Wife remains at bedside

## 2023-12-07 NOTE — ED Notes (Signed)
Pt reports about 15 minutes ago, he suddenly started to feel better. Pt states dizziness has subsided and "feels like he woke up". Wife at bedside. Dolores Frame, MD made aware.

## 2023-12-07 NOTE — Discharge Instructions (Addendum)
Stop using Sudafed.  Continue to take your blood pressure medicine daily as directed by your doctor.  Return to the ER for worsening symptoms, persistent vomiting, difficulty breathing or other concerns.

## 2023-12-07 NOTE — ED Triage Notes (Addendum)
Pt arrived via POV with family reports he woke up freezing on his skin, reports cough sxs for a few weeks, states he has been taking cough medications, including sudafed, c/o congestion and dizziness.  Pt states he started feeling dizzy about 1 hour ago, pt states he woke up in a sweat. Pt states the dizziness began after he woke up.   Denies any chest pain, however, c/o heartburn sxs.   Pt does report hx of HTN, was off his Losartan for a week and started back on Friday, taking 2 pills per day.  Pt reported to family that he is burning up from the inside out.   Pt reports taking 4 hour sudafed since Friday or Saturday taking 2 pills twice a day if needed. Last dose of sudafed was around 8pm.

## 2023-12-07 NOTE — ED Notes (Signed)
Pt c/o tingling all over, reported multiple times in triage that he was going to pass out

## 2023-12-07 NOTE — ED Provider Notes (Signed)
[facility] Provider Note    Event Date/Time   First MD Initiated Contact with Patient 12/07/23 0217     (approximate)   History   Dizziness and Hypertension   HPI  Keith York is a 53 y.o. male who presents to the ED from home with a chief complaint of near syncope, cough symptoms for a few weeks, freezing skin, feeling like he has burning from the inside out.  History of hypertension on losartan.  Patient was out of his blood pressure medication x 1 week, recently restarted several days ago.  Has been taking Sudafed for cough and congestion symptoms.  States he awoke drenched in sweat about an hour prior to arrival feeling like he was going to pass out and dizzy.  Endorses mild shortness of breath and nausea.  Denies headache, vision changes, chest pain, abdominal pain, vomiting.     Past Medical History   Past Medical History:  Diagnosis Date   Allergy    Family history of colonic polyps    Family history of Lynch syndrome    Sinus congestion      Active Problem List   Patient Active Problem List   Diagnosis Date Noted   Genetic testing 01/01/2021   Family history of colonic polyps    Family history of Lynch syndrome    Epigastric hernia 02/18/2016     Past Surgical History   Past Surgical History:  Procedure Laterality Date   EPIGASTRIC HERNIA REPAIR N/A 04/29/2021   Procedure: HERNIA REPAIR EPIGASTRIC ADULT;  Surgeon: Earline Mayotte, MD;  Location: [facility] ORS;  Service: General;  Laterality: N/A;   WISDOM TOOTH EXTRACTION  1995     Home Medications   Prior to Admission medications   Medication Sig Start Date End Date Taking? Authorizing Provider  Cyanocobalamin (B-12 PO) Take 1 tablet by mouth daily.    [provider]  loratadine (CLARITIN) 10 MG tablet Take 10 mg by mouth daily.    [provider]  Magnesium Hydroxide (MAGNESIA PO) Take 1 tablet by mouth daily.    [provider]  montelukast  (SINGULAIR) 10 MG tablet Take 10 mg by mouth daily.    [provider]     Allergies  Penicillins   Family History   Family History  Problem Relation Age of Onset   Arthritis/Rheumatoid Father    Dementia Father    Colon polyps Brother        PMS2+ Lynch syndrome   Lung cancer Maternal Grandmother      Physical Exam  Triage Vital Signs: ED Triage Vitals  Encounter Vitals Group     BP 12/07/23 0154 (!) 192/118     Systolic BP Percentile --      Diastolic BP Percentile --      Pulse Rate 12/07/23 0154 99     Resp 12/07/23 0154 20     Temp 12/07/23 0220 97.9 F (36.6 C)     Temp Source 12/07/23 0154 Oral     SpO2 12/07/23 0154 98 %     Weight 12/07/23 0155 248 lb (112.5 kg)     Height 12/07/23 0155 5\' 11"  (1.803 m)     Head Circumference --      Peak Flow --      Pain Score 12/07/23 0159 0     Pain Loc --      Pain Education --      Exclude from Growth Chart --  Updated Vital Signs: BP (!) 177/96 (BP Location: Left Arm)   Pulse 96   Temp 98 F (36.7 C) (Oral)   Resp 18   Ht 5\' 11"  (1.803 m)   Wt 112.5 kg   SpO2 98%   BMI 34.59 kg/m    General: Awake, mild to moderate distress.  Anxious. CV:  RRR.  Good peripheral perfusion.  Resp:  Normal effort.  CTAB. Abd:  Nontender.  No distention.  Other:  PERRL.  EOMI.  No carotid bruits.  Supple neck without meningismus.  Alert and oriented x 3.  CN II-XII grossly intact.  5/5 motor strength and sensation all extremities.  MAE x 4.   ED Results / Procedures / Treatments  Labs (all labs ordered are listed, but only abnormal results are displayed) Labs Reviewed  RESP PANEL BY RT-PCR (RSV, FLU A&B, COVID)  RVPGX2 - Abnormal; Notable for the following components:      Result Value   SARS Coronavirus 2 by RT PCR POSITIVE (*)    All other components within normal limits  BASIC METABOLIC PANEL - Abnormal; Notable for the following components:   Potassium 3.1 (*)    CO2 21 (*)    Glucose, Bld 134 (*)     All other components within normal limits  HEPATIC FUNCTION PANEL - Abnormal; Notable for the following components:   ALT 76 (*)    All other components within normal limits  CBC  TSH  LIPASE, BLOOD  TROPONIN I (HIGH SENSITIVITY)  TROPONIN I (HIGH SENSITIVITY)     EKG  ED ECG REPORT I, Taden Witter J, the attending physician, personally viewed and interpreted this ECG.   Date: 12/07/2023  EKG Time: 0151  Rate: 91  Rhythm: normal sinus rhythm  Axis: Normal  Intervals:none  ST&T Change: Nonspecific    RADIOLOGY I have independently visualized and interpreted patient's imaging study as well as noted the radiology interpretation:  CT head: No ICH  Chest x-ray: No acute cardiopulmonary process  Official radiology report(s): DG Chest 2 View Result Date: 12/07/2023 CLINICAL DATA:  Cough EXAM: CHEST - 2 VIEW COMPARISON:  None Available. FINDINGS: The heart size and mediastinal contours are within normal limits. Both lungs are clear. The visualized skeletal structures are unremarkable. IMPRESSION: No active cardiopulmonary disease. Electronically Signed   By: Deatra Robinson M.D.   On: 12/07/2023 02:49   CT Head Wo Contrast Result Date: 12/07/2023 CLINICAL DATA:  Mental status change, unknown cause EXAM: CT HEAD WITHOUT CONTRAST TECHNIQUE: Contiguous axial images were obtained from the base of the skull through the vertex without intravenous contrast. RADIATION DOSE REDUCTION: This exam was performed according to the departmental dose-optimization program which includes automated exposure control, adjustment of the mA and/or kV according to patient size and/or use of iterative reconstruction technique. COMPARISON:  None Available. FINDINGS: Brain: No evidence of acute infarction, hemorrhage, hydrocephalus, extra-axial collection or mass lesion/mass effect. Vascular: No hyperdense vessel or unexpected calcification. Skull: Normal. Negative for fracture or focal lesion. Sinuses/Orbits: No  acute finding. Other: Mastoid air cells and middle ear cavities are clear. IMPRESSION: 1. No acute intracranial abnormality. Electronically Signed   By: Helyn Numbers M.D.   On: 12/07/2023 02:42     PROCEDURES:  Critical Care performed: Yes, see critical care procedure note(s)  CRITICAL CARE Performed by: Irean Hong   Total critical care time: 20 minutes  Critical care time was exclusive of separately billable procedures and treating other patients.  Critical care was necessary to treat  or prevent imminent or life-threatening deterioration.  Critical care was time spent personally by me on the following activities: development of treatment plan with patient and/or surrogate as well as nursing, discussions with consultants, evaluation of patient's response to treatment, examination of patient, obtaining history from patient or surrogate, ordering and performing treatments and interventions, ordering and review of laboratory studies, ordering and review of radiographic studies, pulse oximetry and re-evaluation of patient's condition.   Marland Kitchen1-3 Lead EKG Interpretation  Performed by: Irean Hong, MD Authorized by: Irean Hong, MD     Interpretation: normal     ECG rate:  95   ECG rate assessment: normal     Rhythm: sinus rhythm     Ectopy: none     Conduction: normal   Comments:     Patient placed on cardiac monitor to evaluate for arrhythmias    MEDICATIONS ORDERED IN ED: Medications  sodium chloride 0.9 % bolus 1,000 mL (0 mLs Intravenous Stopped 12/07/23 0326)  ondansetron (ZOFRAN) injection 4 mg (4 mg Intravenous Given 12/07/23 0257)  potassium chloride SA (KLOR-CON M) CR tablet 40 mEq (40 mEq Oral Given 12/07/23 0336)  famotidine (PEPCID) IVPB 20 mg premix (0 mg Intravenous Stopped 12/07/23 0351)  hydrALAZINE (APRESOLINE) injection 5 mg (5 mg Intravenous Given 12/07/23 0336)     IMPRESSION / MDM / ASSESSMENT AND PLAN / ED COURSE  I reviewed the triage vital signs and the  nursing notes.                             53 year old male presenting with near syncope and hypertension.  Differential diagnosis includes but is not limited to CVA, ICH, ACS, metabolic, infectious etiologies, etc.  I personally reviewed patient's records and note a series of physical therapy sessions for left hip pain, last on 09/07/2023.  Patient's presentation is most consistent with acute presentation with potential threat to life or bodily function.  The patient is on the cardiac monitor to evaluate for evidence of arrhythmia and/or significant heart rate changes.  Will obtain cardiac and respiratory panels, CT head, chest x-ray.  Will reassess.  Clinical Course as of 12/07/23 0555  Wed Dec 07, 2023  0241 BP 176/85, patient feeling lightheaded like he is going to pass out.  Wet read CT head negative for ICH.  Will administer IV fluids, IV Zofran for nausea. [JS]  0342 Reports symptoms have completely resolved.  He is no longer feeling dizzy or lightheaded. [JS]  [id number] Updated patient and spouse on all lab work and imaging results.  He is smiling, feeling back to baseline, voices no complaints.  Advised patient to refrain from using Sudafed, instead use sinus nasal rinses.  Patient will follow-up closely with his PCP.  Strict return precautions given.  Patient and spouse verbalized understanding and agree with plan of care. [JS]    Clinical Course User Index [JS] Irean Hong, MD     FINAL CLINICAL IMPRESSION(S) / ED DIAGNOSES   Final diagnoses:  Dizziness  Hypertension, unspecified type  COVID-19  Hypokalemia     Rx / DC Orders   ED Discharge Orders     None        Note:  This document was prepared using Dragon voice recognition software and may include unintentional dictation errors.   Irean Hong, MD 12/07/23 [phone]

## 2023-12-21 ENCOUNTER — Ambulatory Visit (INDEPENDENT_AMBULATORY_CARE_PROVIDER_SITE_OTHER): Payer: Self-pay | Admitting: Surgery

## 2023-12-21 ENCOUNTER — Encounter: Payer: Self-pay | Admitting: Surgery

## 2023-12-21 VITALS — BP 141/84 | HR 86 | Temp 98.2°F | Ht 71.0 in | Wt 231.0 lb

## 2023-12-21 DIAGNOSIS — K4090 Unilateral inguinal hernia, without obstruction or gangrene, not specified as recurrent: Secondary | ICD-10-CM

## 2023-12-21 NOTE — Progress Notes (Signed)
 12/21/2023  Reason for Visit:  Right inguinal hernia  Requesting Provider:  Presley Raddle, MD  History of Present Illness: Keith York is a 53 y.o. male presenting for evaluation of a right inguinal hernia.  The patient reports that he has noticed this for the last year and a half and reports that he is having some mild discomfort in the right groin particularly when the hernia bulges out more.  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  He is wondering about hernia repair in the future and also trying to figure out costs as well.  He had a prior supraumbilical hernia repair with Dr. Lemar Livings in 2022 and denies any issues in that area.  Also denies any symptoms in the left groin.  Denies any nausea, vomiting, constipation, diarrhea.  Past Medical History: Past Medical History:  Diagnosis Date   Allergy    Family history of colonic polyps    Family history of Lynch syndrome    Sinus congestion      Past Surgical History: Past Surgical History:  Procedure Laterality Date   EPIGASTRIC HERNIA REPAIR N/A 04/29/2021   Procedure: HERNIA REPAIR EPIGASTRIC ADULT;  Surgeon: Earline Mayotte, MD;  Location: [facility] ORS;  Service: General;  Laterality: N/A;   WISDOM TOOTH EXTRACTION  1995    Home Medications: Prior to Admission medications   Medication Sig Start Date End Date Taking? Authorizing Provider  Cholecalciferol (VITAMIN D3 PO) Take by mouth.   Yes [provider]  esomeprazole (NEXIUM) 20 MG capsule Take 20 mg by mouth daily at 12 noon.   Yes [provider]  loratadine (CLARITIN) 10 MG tablet Take 10 mg by mouth daily.   Yes [provider]  MAGNESIUM GLYCINATE PO Take 100 mg by mouth daily.   Yes [provider]  Menaquinone-7 (VITAMIN K2 PO) Take by mouth.   Yes [provider]  montelukast (SINGULAIR) 10 MG tablet Take 10 mg by mouth daily.   Yes [provider]  Multiple Vitamin  (MULTIVITAMIN) capsule Take 1 capsule by mouth daily.   Yes [provider]    Allergies: Allergies  Allergen Reactions   Penicillins Rash    Social History:  reports that he quit smoking about 29 years ago. His smoking use included cigarettes. He has been exposed to tobacco smoke. He has never used smokeless tobacco. He reports that he does not drink alcohol and does not use drugs.   Family History: Family History  Problem Relation Age of Onset   Arthritis/Rheumatoid Father    Dementia Father    Colon polyps Brother        PMS2+ Lynch syndrome   Lung cancer Maternal Grandmother     Review of Systems: Review of Systems  Constitutional:  Negative for chills and fever.  Respiratory:  Negative for shortness of breath.   Cardiovascular:  Negative for chest pain.  Gastrointestinal:  Positive for abdominal pain (mild discomfort in right groin). Negative for nausea and vomiting.    Physical Exam BP (!) 141/84   Pulse 86   Temp 98.2 F (36.8 C)   Ht 5\' 11"  (1.803 m)   Wt 231 lb (104.8 kg)   SpO2 98%   BMI 32.22 kg/m  CONSTITUTIONAL: No acute distress, well nourished. HEENT:  Normocephalic, atraumatic, extraocular motion intact.  RESPIRATORY:  Lungs are clear, and breath sounds are equal bilaterally. Normal respiratory effort without pathologic use of accessory muscles. CARDIOVASCULAR: Heart is  regular without murmurs, gallops, or rubs. GI: The abdomen is soft, non-distended, with mild discomfort to palpation in the right groin.  The patient does have a reducible right inguinal hernia.  No evidence of hernia in the left groin.  His supraumbilical hernia repair appears to be intact without evidence of recurrence. MUSCULOSKELETAL:  Normal muscle strength and tone in all four extremities.  No peripheral edema or cyanosis. NEUROLOGIC:  Motor and sensation is grossly normal.  Cranial nerves are grossly intact. PSYCH:  Alert and oriented to person, place and time. Affect is  normal.  Laboratory Analysis: Labs from 12/07/23: Na 135, K 3.1, Cl 100, CO2 21, BUN 12, Cr 0.9.  Total bili 0.6, AST 37, ALT 76, Alk Phos 51.  Albumin 4.4  WBC 5.5, Hgb 15.8, Hct 45.1, Plt 238.  Imaging: No results found.  Assessment and Plan: This is a 53 y.o. male with a reducible right inguinal hernia.  -- Discussed with patient the findings on exam that indeed he does have a reducible right inguinal hernia.  On exam, I cannot palpate any issues on the left groin or in the supraumbilical area.  Discussed with him that we can certainly offer surgical repair of his hernia and this could be done via robotic approach.  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  The patient overall has only mild symptoms with occasional discomfort particular if he is doing anything more strenuous.  He has been trying to avoid strenuous activity recently.  I think it would be reasonable to wait until then for his surgery.  However recommended that he continue taking it easy and avoid strenuous activity. - Briefly discussed with him that come November, we will be able to do a robotic assisted right inguinal hernia repair.  The benefit of this approach is also that we could evaluate the left groin intra-abdominal he and if there is a hernia forming in that area, we could repair both sides at the same time.  For now, we will tentatively schedule him for 08/30/2024 so that we can get him in the system so that he can start the billing estimate process as he would be self-pay first given his insurance. - Patient will follow-up with me around mid October so that we can discuss surgery further.  Return precautions given to the patient as well.  I spent 30 minutes dedicated to the care of this patient on the date of this encounter to include pre-visit review of records, face-to-face time with the patient  discussing diagnosis and management, and any post-visit coordination of care.   Howie Ill, MD [facility]

## 2023-12-21 NOTE — Patient Instructions (Addendum)
 We will see you back in the fall to speak about scheduling surgery for you with Dr Aleen Campi. We will send you a letter about this appointment. Do call us if you need to be seen sooner.   We have spoken today about having your hernia repaired. This would be done by Dr. Aleen Campi at [facility].  You will need to arrange to be out of work for approximately 1-2 weeks and then you may return with a lifting restriction for 4 more weeks.  You may have a bruise in your groin and also swelling and brusing in your testicle area. You may use ice 4-5 times daily for 15-20 minutes each time. Make sure that you place a barrier between you and the ice pack. To decrease the swelling, you may roll up a bath towel and place it vertically in between your thighs with your testicles resting on the towel. You will want to keep this area elevated as much as possible for several days following surgery.  Laparoscopic Surgery Using A Robot: What to Expect Laparoscopic surgery using a robot is a procedure that allows a surgeon to use a robot to control tools and a camera. Unlike traditional (open) surgery, in which a large cut (incision) is made, this type of surgery uses a few small cuts. This is also called minimally invasive surgery. During the surgery, a thin scope with a camera on the end (laparoscope) is inserted through one of the cuts. The image from the camera is shown on a monitor to help the surgeon see inside the body. This type of surgery is commonly used for: Prostate surgery. Surgery on the uterus or ovaries. Kidney surgery. Colon surgery. Hernia repair surgery. Surgery for weight loss (bariatric surgery). Tell a health care provider about: Any allergies you have. All medicines you're taking, including vitamins, herbs, eye drops, creams, and over-the-counter medicines. Any problems you or family members have had with anesthesia. Any bleeding problems you have. Any surgeries you've had. Any medical  conditions you have. Whether you're pregnant or may be pregnant. Your use of tobacco or nicotine products. Your use of alcohol or drugs. What are the risks of this type of surgery? Your health care provider will talk with you about risks. These may include: Infection. Bleeding. Damage to nearby structures or organs. Allergic reactions to medicines or dyes. Pain, bruising, and swelling. Other side effects may depend on the type of surgery that's done. What are the benefits? This type of surgery also has benefits. Because the cuts are small, this may result in: A shorter hospital stay. Faster healing time. Less blood loss. Less scarring. Less pain. In some cases, this type of surgery may also take less time to do than regular, open surgery. What happens before the surgery? Your provider will go over the procedures that will take place. Ask your provider: What is the goal of the surgery? Is this surgery the best treatment option? What other options do I have? How much experience does the surgeon have with this type of surgery? How long will surgery take? How long will it take to recover from surgery? What are the costs, and how do they compare to other surgical choices? How should I prepare for surgery? What happens during the surgery? There are differences between surgeries done with a robot and those done without a robot. These include: During open surgery, a large cut is made. This allows the surgeon to access the organ or body part that they are operating on.  The surgeon holds the surgical tools in their hands. During a laparoscopic procedure, a few small cuts are made. A camera is inserted so the surgeon can see the body part. The surgeon holds the tools in their hands to reach the body part through the small cuts. During a laparoscopic procedure that uses a robot, a few small cuts are made. The surgeon sits at a console in the operating room. The surgeon uses the controls at the  console to move the robotic arms. The tools of surgery and laparoscope are attached to the arms of the robot. Ask your provider about any concerns you may have. What can I expect after the surgery? After your procedure, it's common to have: Mild discomfort in the belly. You may have pain, bruising, or swelling in the areas where cuts were made. Bloating. Shoulder pain. This comes from the gas that was used during the procedure. Sore throat. Follow these instructions at home: Rest as told. Get up to take short walks at least every 2 hours during the day. This helps you breathe better and keeps your blood flowing. Ask for help if you feel weak or unsteady. Drink more fluids as told. Do not smoke, vape, or use nicotine or tobacco. This information is not intended to replace advice given to you by your health care provider. Make sure you discuss any questions you have with your health care provider. Document Revised: 02/11/2023 Document Reviewed: 02/11/2023 Elsevier Patient Education  2024 Elsevier Inc.  Inguinal Hernia, Adult Muscles help keep everything in the body in its proper place. But if a weak spot in the muscles develops, something can poke through. That is called a hernia. When this happens in the lower part of the belly (abdomen), it is called an inguinal hernia. (It takes its name from a part of the body in this region called the inguinal canal.) A weak spot in the wall of muscles lets some fat or part of the small intestine bulge through. An inguinal hernia can develop at any age. Men get them more often than women. CAUSES  In adults, an inguinal hernia develops over time. It can be triggered by: Suddenly straining the muscles of the lower abdomen. Lifting heavy objects. Straining to have a bowel movement. Difficult bowel movements (constipation) can lead to this. Constant coughing. This may be caused by smoking or lung disease. Being overweight. Being pregnant. Working at a job  that requires long periods of standing or heavy lifting. Having had an inguinal hernia before. One type can be an emergency situation. It is called a strangulated inguinal hernia. It develops if part of the small intestine slips through the weak spot and cannot get back into the abdomen. The blood supply can be cut off. If that happens, part of the intestine may die. This situation requires emergency surgery. SYMPTOMS  Often, a small inguinal hernia has no symptoms. It is found when a healthcare provider does a physical exam. Larger hernias usually have symptoms.  In adults, symptoms may include: A lump in the groin. This is easier to see when the person is standing. It might disappear when lying down. In men, a lump in the scrotum. Pain or burning in the groin. This occurs especially when lifting, straining or coughing. A dull ache or feeling of pressure in the groin. Signs of a strangulated hernia can include: A bulge in the groin that becomes very painful and tender to the touch. A bulge that turns red or purple. Fever, nausea  and vomiting. Inability to have a bowel movement or to pass gas. DIAGNOSIS  To decide if you have an inguinal hernia, a healthcare provider will probably do a physical examination. This will include asking questions about any symptoms you have noticed. The healthcare provider might feel the groin area and ask you to cough. If an inguinal hernia is felt, the healthcare provider may try to slide it back into the abdomen. Usually no other tests are needed. TREATMENT  Treatments can vary. The size of the hernia makes a difference. Options include: Watchful waiting. This is often suggested if the hernia is small and you have had no symptoms. No medical procedure will be done unless symptoms develop. You will need to watch closely for symptoms. If any occur, contact your healthcare provider right away. Surgery. This is used if the hernia is larger or you have  symptoms. Open surgery. This is usually an outpatient procedure (you will not stay overnight in a hospital). An cut (incision) is made through the skin in the groin. The hernia is put back inside the abdomen. The weak area in the muscles is then repaired by herniorrhaphy or hernioplasty. Herniorrhaphy: in this type of surgery, the weak muscles are sewn back together. Hernioplasty: a patch or mesh is used to close the weak area in the abdominal wall. Laparoscopy. In this procedure, a surgeon makes small incisions. A thin tube with a tiny video camera (called a laparoscope) is put into the abdomen. The surgeon repairs the hernia with mesh by looking with the video camera and using two long instruments. HOME CARE INSTRUCTIONS  After surgery to repair an inguinal hernia: You will need to take pain medicine prescribed by your healthcare provider. Follow all directions carefully. You will need to take care of the wound from the incision. Your activity will be restricted for awhile. This will probably include no heavy lifting for several weeks. You also should not do anything too active for a few weeks. When you can return to work will depend on the type of job that you have. During "watchful waiting" periods, you should: Maintain a healthy weight. Eat a diet high in fiber (fruits, vegetables and whole grains). Drink plenty of fluids to avoid constipation. This means drinking enough water and other liquids to keep your urine clear or pale yellow. Do not lift heavy objects. Do not stand for long periods of time. Quit smoking. This should keep you from developing a frequent cough. SEEK MEDICAL CARE IF:  A bulge develops in your groin area. You feel pain, a burning sensation or pressure in the groin. This might be worse if you are lifting or straining. You develop a fever of more than 100.5 F (38.1 C). SEEK IMMEDIATE MEDICAL CARE IF:  Pain in the groin increases suddenly. A bulge in the groin gets  bigger suddenly and does not go down. For men, there is sudden pain in the scrotum. Or, the size of the scrotum increases. A bulge in the groin area becomes red or purple and is painful to touch. You have nausea or vomiting that does not go away. You feel your heart beating much faster than normal. You cannot have a bowel movement or pass gas. You develop a fever of more than 102.0 F (38.9 C).   This information is not intended to replace advice given to you by your health care provider. Make sure you discuss any questions you have with your health care provider.   Document Released: 02/20/2009 Document  Revised: 12/27/2011 Document Reviewed: 04/07/2015 Elsevier Interactive Patient Education [employer]

## 2023-12-22 ENCOUNTER — Telehealth: Payer: Self-pay | Admitting: Surgery

## 2023-12-22 NOTE — Telephone Encounter (Signed)
 Patient has been advised of Pre-Admission date/time, and Surgery date at [facility].  Blue information sheet regarding surgery also mailed.    Surgery Date: 08/30/24 Preadmission Testing Date: 08/20/24 (phone 8a-1p)  Patient has been made aware to call [phone], between 1-3:00pm the day before surgery, to find out what time to arrive for surgery.

## 2024-08-17 ENCOUNTER — Ambulatory Visit: Payer: Self-pay | Admitting: Surgery

## 2024-08-17 ENCOUNTER — Encounter: Payer: Self-pay | Admitting: Surgery

## 2024-08-17 VITALS — BP 155/85 | HR 86 | Ht 71.0 in | Wt 239.0 lb

## 2024-08-17 DIAGNOSIS — K4090 Unilateral inguinal hernia, without obstruction or gangrene, not specified as recurrent: Secondary | ICD-10-CM

## 2024-08-17 NOTE — H&P (View-Only) (Signed)
 08/17/2024  History of Present Illness: Keith York is a 53 y.o. male presenting for follow-up of a right inguinal hernia.  The patient was last seen on 12/21/2023 for this.  The patient wanted to wait until the fall in order to do surgery during which time his work would be less busy.  He now is ready for surgery.  He reports that he feels that the hernia may be slightly bigger in size.  However the symptoms have remained relatively similar.  Denies any worsening pain, incarceration or strangulation of the right groin.  Patient is scheduled currently for robotic right inguinal hernia repair on 08/30/2024.  He reports that the billing office from the hospital has not contacted him yet about an estimate of what he needs to pay.  Past Medical History: Past Medical History:  Diagnosis Date   Allergy    Family history of colonic polyps    Family history of Lynch syndrome    Sinus congestion      Past Surgical History: Past Surgical History:  Procedure Laterality Date   EPIGASTRIC HERNIA REPAIR N/A 04/29/2021   Procedure: HERNIA REPAIR EPIGASTRIC ADULT;  Surgeon: Dessa Reyes ORN, MD;  Location: [facility] ORS;  Service: General;  Laterality: N/A;   WISDOM TOOTH EXTRACTION  1995    Home Medications: Prior to Admission medications   Medication Sig Start Date End Date Taking? Authorizing Provider  Cholecalciferol (VITAMIN D3 PO) Take by mouth.   Yes [provider]  esomeprazole (NEXIUM) 20 MG capsule Take 20 mg by mouth daily.   Yes [provider]  loratadine (CLARITIN) 10 MG tablet Take 10 mg by mouth daily.   Yes [provider]  losartan (COZAAR) 25 MG tablet Take 25 mg by mouth daily.   Yes [provider]  Magnesium Glycinate 100 MG CAPS Take 100 mg by mouth daily.   Yes [provider]  Menaquinone-7 (VITAMIN K2 PO) Take 1 tablet by mouth daily.   Yes [provider]  montelukast (SINGULAIR) 10 MG tablet Take 10 mg by mouth daily.    Yes [provider]  Multiple Vitamin (MULTIVITAMIN) capsule Take 1 capsule by mouth daily.   Yes [provider]    Allergies: Allergies  Allergen Reactions   Penicillins Rash    Review of Systems: Review of Systems  Constitutional:  Negative for chills and fever.  Respiratory:  Negative for shortness of breath.   Cardiovascular:  Negative for chest pain.  Gastrointestinal:  Negative for abdominal pain and nausea.  Genitourinary:  Negative for dysuria.    Physical Exam BP (!) 155/85   Pulse 86   Ht 5' 11 (1.803 m)   Wt 239 lb (108.4 kg)   SpO2 98%   BMI 33.33 kg/m  CONSTITUTIONAL: No acute distress HEENT:  Normocephalic, atraumatic, extraocular motion intact. RESPIRATORY:  Lungs are clear, and breath sounds are equal bilaterally. Normal respiratory effort without pathologic use of accessory muscles. CARDIOVASCULAR: Heart is regular without murmurs, gallops, or rubs. GI: The abdomen is soft, nondistended, with mild soreness to palpation in the right groin.  The patient has a reducible right inguinal hernia which appears to be stable in size.  The patient does not have any palpable hernia in the left groin.  NEUROLOGIC:  Motor and sensation is grossly normal.  Cranial nerves are grossly intact. PSYCH:  Alert and oriented to person, place and time. Affect is normal.   Assessment and Plan: This is a 53 y.o. male with a  right inguinal hernia.  - Patient scheduled for surgery on 08/30/2024.  On exam, the right inguinal hernia appears to be stable and the symptoms appear to be stable as well.  The patient is ready for surgery and now his job is less busy.  Reviewed with him again the plan for a robotic assisted right inguinal hernia repair I reviewed with him the planned incisions, risks of bleeding, infection, injury to surrounding structures, this will be an outpatient procedure, the ability to evaluate the left groin at the same time and if there is a hernia  present repair at the same time, postoperative activity restrictions, pain control, and he is willing to proceed. - Will give him the phone number to call health billing services. - All of his questions have been answered  I spent 20 minutes dedicated to the care of this patient on the date of this encounter to include pre-visit review of records, face-to-face time with the patient discussing diagnosis and management, and any post-visit coordination of care.   Aloysius Sheree Plant, MD [facility]

## 2024-08-17 NOTE — Patient Instructions (Signed)
 Laparoscopic Surgery for Groin Hernia in Adults: What to Know After After a laparoscopic surgery for groin hernia, it's common to have pain, discomfort, soreness, swelling, and bruising around the cuts that were made in the belly. There may also be swelling of the scrotum in males. Follow these instructions at home: Activity Rest as told. Get up and take short walks many times during the day. This helps you breathe better and keeps your blood flowing. Ask for help if you feel weak or unsteady. Ask if it's OK for you to lift. Do not take baths, swim, or use a hot tub until you're told it's OK. Ask if you can shower. Ask what things are safe for you to do at home. Ask when you can go back to work or school. Medicines Take your medicines only as told. You may need to take steps to help treat or prevent trouble pooping (constipation), such as: Taking medicine to help you poop. Eating foods high in fiber, like beans, whole grains, and fresh fruits and vegetables. Drinking more fluids as told. Ask your health care provider if it's safe to drive or use machines while taking your medicine. Wound care  Take care of the cuts in your belly as told. Make sure you: Wash your hands with soap and water for at least 20 seconds before and after you change your bandage. If you can't use soap and water, use hand sanitizer. Change your bandage. Leave stitches or skin glue alone. Leave tape strips alone unless you're told to take them off. You may trim the edges of the tape strips if they curl up. Check the cuts on your belly every day for signs of infection. Check for: More redness, swelling, or pain. More fluid or blood. Warmth. Pus or a bad smell. Pain management  Use ice or an ice pack as told. Place a towel between your skin and the ice. Leave the ice on for 20 minutes, 2-3 times a day. If your skin turns red, take off the ice right away to prevent skin damage. The risk of damage is higher if you  can't feel pain, heat, or cold. General instructions Do not smoke, vape, or use nicotine or tobacco. Doing this can slow healing. Wear compression stockings to reduce swelling and help prevent blood clots in your legs. You may be asked to continue to do deep breathing exercises at home. This will help to prevent a lung infection. Your provider may give you more instructions. Make sure you know what you can and can't do. Contact a health care provider if: You have any signs of infection. You have more swelling or pain in your scrotum. You have pain that gets worse or doesn't get better with medicine. You aren't able to pee. You haven't pooped in 3 days. You have a fever. You throw up or you feel like throwing up. Get help right away if: You have redness, warmth, or pain in your leg. You have chest pain. You have trouble breathing. You have very bad pain in your belly. You throw up each time you eat or drink. These symptoms may be an emergency. Call 911 right away. Do not wait to see if the symptoms will go away. Do not drive yourself to the hospital. This information is not intended to replace advice given to you by your health care provider. Make sure you discuss any questions you have with your health care provider. Document Revised: 07/19/2023 Document Reviewed: 07/19/2023 Elsevier Patient Education  2025  [employer].

## 2024-08-17 NOTE — Progress Notes (Signed)
 08/17/2024  History of Present Illness: Keith York is a 53 y.o. male presenting for follow-up of a right inguinal hernia.  The patient was last seen on 12/21/2023 for this.  The patient wanted to wait until the fall in order to do surgery during which time his work would be less busy.  He now is ready for surgery.  He reports that he feels that the hernia may be slightly bigger in size.  However the symptoms have remained relatively similar.  Denies any worsening pain, incarceration or strangulation of the right groin.  Patient is scheduled currently for robotic right inguinal hernia repair on 08/30/2024.  He reports that the billing office from the hospital has not contacted him yet about an estimate of what he needs to pay.  Past Medical History: Past Medical History:  Diagnosis Date   Allergy    Family history of colonic polyps    Family history of Lynch syndrome    Sinus congestion      Past Surgical History: Past Surgical History:  Procedure Laterality Date   EPIGASTRIC HERNIA REPAIR N/A 04/29/2021   Procedure: HERNIA REPAIR EPIGASTRIC ADULT;  Surgeon: Dessa Reyes ORN, MD;  Location: [facility] ORS;  Service: General;  Laterality: N/A;   WISDOM TOOTH EXTRACTION  1995    Home Medications: Prior to Admission medications   Medication Sig Start Date End Date Taking? Authorizing Provider  Cholecalciferol (VITAMIN D3 PO) Take by mouth.   Yes [provider]  esomeprazole (NEXIUM) 20 MG capsule Take 20 mg by mouth daily.   Yes [provider]  loratadine (CLARITIN) 10 MG tablet Take 10 mg by mouth daily.   Yes [provider]  losartan (COZAAR) 25 MG tablet Take 25 mg by mouth daily.   Yes [provider]  Magnesium Glycinate 100 MG CAPS Take 100 mg by mouth daily.   Yes [provider]  Menaquinone-7 (VITAMIN K2 PO) Take 1 tablet by mouth daily.   Yes [provider]  montelukast (SINGULAIR) 10 MG tablet Take 10 mg by mouth daily.    Yes [provider]  Multiple Vitamin (MULTIVITAMIN) capsule Take 1 capsule by mouth daily.   Yes [provider]    Allergies: Allergies  Allergen Reactions   Penicillins Rash    Review of Systems: Review of Systems  Constitutional:  Negative for chills and fever.  Respiratory:  Negative for shortness of breath.   Cardiovascular:  Negative for chest pain.  Gastrointestinal:  Negative for abdominal pain and nausea.  Genitourinary:  Negative for dysuria.    Physical Exam BP (!) 155/85   Pulse 86   Ht 5' 11 (1.803 m)   Wt 239 lb (108.4 kg)   SpO2 98%   BMI 33.33 kg/m  CONSTITUTIONAL: No acute distress HEENT:  Normocephalic, atraumatic, extraocular motion intact. RESPIRATORY:  Lungs are clear, and breath sounds are equal bilaterally. Normal respiratory effort without pathologic use of accessory muscles. CARDIOVASCULAR: Heart is regular without murmurs, gallops, or rubs. GI: The abdomen is soft, nondistended, with mild soreness to palpation in the right groin.  The patient has a reducible right inguinal hernia which appears to be stable in size.  The patient does not have any palpable hernia in the left groin.  NEUROLOGIC:  Motor and sensation is grossly normal.  Cranial nerves are grossly intact. PSYCH:  Alert and oriented to person, place and time. Affect is normal.   Assessment and Plan: This is a 53 y.o. male with a  right inguinal hernia.  - Patient scheduled for surgery on 08/30/2024.  On exam, the right inguinal hernia appears to be stable and the symptoms appear to be stable as well.  The patient is ready for surgery and now his job is less busy.  Reviewed with him again the plan for a robotic assisted right inguinal hernia repair I reviewed with him the planned incisions, risks of bleeding, infection, injury to surrounding structures, this will be an outpatient procedure, the ability to evaluate the left groin at the same time and if there is a hernia  present repair at the same time, postoperative activity restrictions, pain control, and he is willing to proceed. - Will give him the phone number to call health billing services. - All of his questions have been answered  I spent 20 minutes dedicated to the care of this patient on the date of this encounter to include pre-visit review of records, face-to-face time with the patient discussing diagnosis and management, and any post-visit coordination of care.   Aloysius Sheree Plant, MD [facility]

## 2024-08-20 ENCOUNTER — Encounter
Admission: RE | Admit: 2024-08-20 | Discharge: 2024-08-20 | Disposition: A | Discharge status: Home / Self Care | Payer: Self-pay | Source: Ambulatory Visit | Attending: Surgery | Admitting: Surgery

## 2024-08-20 ENCOUNTER — Other Ambulatory Visit: Payer: Self-pay

## 2024-08-20 DIAGNOSIS — Z01810 Encounter for preprocedural cardiovascular examination: Secondary | ICD-10-CM

## 2024-08-20 DIAGNOSIS — I10 Essential (primary) hypertension: Secondary | ICD-10-CM

## 2024-08-20 DIAGNOSIS — Z01812 Encounter for preprocedural laboratory examination: Secondary | ICD-10-CM

## 2024-08-20 DIAGNOSIS — K4090 Unilateral inguinal hernia, without obstruction or gangrene, not specified as recurrent: Secondary | ICD-10-CM

## 2024-08-20 HISTORY — DX: Unilateral inguinal hernia, without obstruction or gangrene, not specified as recurrent: K40.90

## 2024-08-20 HISTORY — DX: Essential (primary) hypertension: I10

## 2024-08-20 HISTORY — DX: Gastro-esophageal reflux disease without esophagitis: K21.9

## 2024-08-20 NOTE — Patient Instructions (Signed)
 Your procedure is scheduled on:08-30-24 Thursday Report to the Registration Desk on the 1st floor of the [facility].Then proceed to the 2nd floor Surgery Desk To find out your arrival time, please call [phone] between 1PM - 3PM on:08-29-24 Wednesday If your arrival time is 6:00 am, do not arrive before that time as the [facility] entrance doors do not open until 6:00 am.  REMEMBER: Instructions that are not followed completely may result in serious medical risk, up to and including death; or upon the discretion of your surgeon and anesthesiologist your surgery may need to be rescheduled.  Do not eat food after midnight the night before surgery.  No gum chewing or hard candies.  You may however, drink CLEAR liquids up to 2 hours before you are scheduled to arrive for your surgery. Do not drink anything within 2 hours of your scheduled arrival time.  Clear liquids include: - water  - apple juice without pulp - gatorade (not RED colors) - black coffee or tea (Do NOT add milk or creamers to the coffee or tea) Do NOT drink anything that is not on this list.  One week prior to surgery:Last dose will be on 08-22-24 Wednesday Stop Anti-inflammatories (NSAIDS) such as Advil, Aleve , Ibuprofen, Motrin, Naproxen , Naprosyn  and Aspirin based products such as Excedrin, Goody's Powder, BC Powder. Stop ANY OVER THE COUNTER supplements until after surgery (Vitamin D3, Magnesium Glycinate, Vitamin K2, Multivitamin)  You may however, continue to take Tylenol  if needed for pain up until the day of surgery.  Continue taking all of your other prescription medications up until the day of surgery.  ON THE DAY OF SURGERY ONLY TAKE THESE MEDICATIONS WITH SIPS OF WATER: -esomeprazole (NEXIUM)   No Alcohol for 24 hours before or after surgery.  No Smoking including e-cigarettes for 24 hours before surgery.  No chewable tobacco products for at least 6 hours before surgery.  No nicotine patches on the  day of surgery.  Do not use any recreational drugs for at least a week (preferably 2 weeks) before your surgery.  Please be advised that the combination of cocaine and anesthesia may have negative outcomes, up to and including death. If you test positive for cocaine, your surgery will be cancelled.  On the morning of surgery brush your teeth with toothpaste and water, you may rinse your mouth with mouthwash if you wish. Do not swallow any toothpaste or mouthwash.  Use CHG Soap as directed on instruction sheet.  Do not wear jewelry, make-up, hairpins, clips or nail polish.  For welded (permanent) jewelry: bracelets, anklets, waist bands, etc.  Please have this removed prior to surgery.  If it is not removed, there is a chance that hospital personnel will need to cut it off on the day of surgery.  Do not wear lotions, powders, or perfumes.   Do not shave body hair from the neck down 48 hours before surgery.  Contact lenses, hearing aids and dentures may not be worn into surgery.  Do not bring valuables to the hospital. [facility] is not responsible for any missing/lost belongings or valuables.   Notify your doctor if there is any change in your medical condition (cold, fever, infection).  Wear comfortable clothing (specific to your surgery type) to the hospital.  After surgery, you can help prevent lung complications by doing breathing exercises.  Take deep breaths and cough every 1-2 hours. Your doctor may order a device called an Incentive Spirometer to help you take deep breaths.  When coughing or sneezing, hold a pillow firmly against your incision with both hands. This is called "splinting." Doing this helps protect your incision. It also decreases belly discomfort.  If you are being admitted to the hospital overnight, leave your suitcase in the car. After surgery it may be brought to your room.  In case of increased patient census, it may be necessary for you, the patient, to  continue your postoperative care in the Same Day Surgery department.  If you are being discharged the day of surgery, you will not be allowed to drive home. You will need a responsible individual to drive you home and stay with you for 24 hours after surgery.   If you are taking public transportation, you will need to have a responsible individual with you.  Please call the Pre-admissions Testing Dept. at [phone] if you have any questions about these instructions.  Surgery Visitation Policy:  Patients having surgery or a procedure may have two visitors.  Children under the age of 40 must have an adult with them who is not the patient.                                                                                                             Preparing for Surgery with CHLORHEXIDINE  GLUCONATE (CHG) Soap  Chlorhexidine  Gluconate (CHG) Soap  o An antiseptic cleaner that kills germs and bonds with the skin to continue killing germs even after washing  o Used for showering the night before surgery and morning of surgery  Before surgery, you can play an important role by reducing the number of germs on your skin.  CHG (Chlorhexidine  gluconate) soap is an antiseptic cleanser which kills germs and bonds with the skin to continue killing germs even after washing.  Please do not use if you have an allergy to CHG or antibacterial soaps. If your skin becomes reddened/irritated stop using the CHG.  1. Shower the NIGHT BEFORE SURGERY with CHG soap.  2. If you choose to wash your hair, wash your hair first as usual with your normal shampoo.  3. After shampooing, rinse your hair and body thoroughly to remove the shampoo.  4. Use CHG as you would any other liquid soap. You can apply CHG directly to the skin and wash gently with a clean washcloth.  5. Apply the CHG soap to your body only from the neck down. Do not use on open wounds or open sores. Avoid contact with your eyes, ears, mouth,  and genitals (private parts). Wash face and genitals (private parts) with your normal soap.  6. Wash thoroughly, paying special attention to the area where your surgery will be performed.  7. Thoroughly rinse your body with warm water.  8. Do not shower/wash with your normal soap after using and rinsing off the CHG soap.  9. Do not use lotions, oils, etc., after showering with CHG.  10. Pat yourself dry with a clean towel.  11. Wear clean pajamas to bed the night before surgery.  12. Place clean sheets  on your bed the night of your shower and do not sleep with pets.  13. Do not apply any deodorants/lotions/powders.  14. Please wear clean clothes to the hospital.  15. Remember to brush your teeth with your regular toothpaste.   Merch[employer]iser, Retail to address health-related social needs:  [email or web address]

## 2024-08-22 ENCOUNTER — Encounter
Admission: RE | Admit: 2024-08-22 | Discharge: 2024-08-22 | Disposition: A | Discharge status: Home / Self Care | Payer: Self-pay | Source: Ambulatory Visit | Attending: Surgery | Admitting: Surgery

## 2024-08-22 DIAGNOSIS — Z01818 Encounter for other preprocedural examination: Secondary | ICD-10-CM | POA: Insufficient documentation

## 2024-08-22 DIAGNOSIS — Z01812 Encounter for preprocedural laboratory examination: Secondary | ICD-10-CM

## 2024-08-22 DIAGNOSIS — Z01810 Encounter for preprocedural cardiovascular examination: Secondary | ICD-10-CM

## 2024-08-22 DIAGNOSIS — I10 Essential (primary) hypertension: Secondary | ICD-10-CM | POA: Insufficient documentation

## 2024-08-22 LAB — BASIC METABOLIC PANEL WITH GFR
Anion gap: 14 (ref 5–15)
BUN: 14 mg/dL (ref 6–20)
CO2: 22 mmol/L (ref 22–32)
Calcium: 9.2 mg/dL (ref 8.9–10.3)
Chloride: 105 mmol/L (ref 98–111)
Creatinine, Ser: 1.02 mg/dL (ref 0.61–1.24)
GFR, Estimated: >60 mL/min (ref >60)
Glucose, Bld: 100 mg/dL — ABNORMAL HIGH (ref 70–99)
Potassium: 4.2 mmol/L (ref 3.5–5.1)
Sodium: 141 mmol/L (ref 135–145)

## 2024-08-22 LAB — CBC
HCT: 45.6 % (ref 39.0–52.0)
Hemoglobin: 15.5 g/dL (ref 13.0–17.0)
MCH: 31.1 pg (ref 26.0–34.0)
MCHC: 34 g/dL (ref 30.0–36.0)
MCV: 91.6 fL (ref 80.0–100.0)
Platelets: 238 K/uL (ref 150–400)
RBC: 4.98 MIL/uL (ref 4.22–5.81)
RDW: 12.1 % (ref 11.5–15.5)
WBC: 7.4 K/uL (ref 4.0–10.5)
nRBC: 0 % (ref 0.0–0.2)

## 2024-08-30 ENCOUNTER — Encounter: Payer: Self-pay | Admitting: Surgery

## 2024-08-30 ENCOUNTER — Ambulatory Visit: Payer: Self-pay | Admitting: Urgent Care

## 2024-08-30 ENCOUNTER — Other Ambulatory Visit: Payer: Self-pay

## 2024-08-30 ENCOUNTER — Encounter
Admission: RE | Disposition: A | Discharge status: Home / Self Care | Payer: Self-pay | Source: Home / Self Care | Attending: Surgery

## 2024-08-30 ENCOUNTER — Ambulatory Visit
Admission: RE | Admit: 2024-08-30 | Discharge: 2024-08-30 | Disposition: A | Discharge status: Home / Self Care | Payer: Self-pay | Attending: Surgery | Admitting: Surgery

## 2024-08-30 ENCOUNTER — Ambulatory Visit: Payer: Self-pay | Admitting: Certified Registered"

## 2024-08-30 DIAGNOSIS — Z87891 Personal history of nicotine dependence: Secondary | ICD-10-CM | POA: Insufficient documentation

## 2024-08-30 DIAGNOSIS — K4090 Unilateral inguinal hernia, without obstruction or gangrene, not specified as recurrent: Secondary | ICD-10-CM | POA: Insufficient documentation

## 2024-08-30 DIAGNOSIS — D176 Benign lipomatous neoplasm of spermatic cord: Secondary | ICD-10-CM | POA: Insufficient documentation

## 2024-08-30 HISTORY — PX: ROBOT ASSISTED INGUINAL HERNIA REPAIR: SHX6561

## 2024-08-30 SURGERY — ROBOT ASSISTED INGUINAL HERNIA REPAIR
Anesthesia: General | Laterality: Right

## 2024-08-30 MED ORDER — OXYCODONE HCL 5 MG PO TABS
5.0000 mg | ORAL_TABLET | Freq: Once | ORAL | Status: AC | PRN
  Administered 2024-08-30: 5 mg via ORAL

## 2024-08-30 MED ORDER — PROPOFOL 10 MG/ML IV BOLUS
INTRAVENOUS | Status: AC
  Filled 2024-08-30: qty 20

## 2024-08-30 MED ORDER — MIDAZOLAM HCL (PF) 2 MG/2ML IJ SOLN
INTRAMUSCULAR | Status: DC | PRN
  Administered 2024-08-30: 2 mg via INTRAVENOUS

## 2024-08-30 MED ORDER — BUPIVACAINE LIPOSOME 1.3 % IJ SUSP: 20.0000 mL | Freq: Once | INTRAMUSCULAR | Status: DC

## 2024-08-30 MED ORDER — BUPIVACAINE LIPOSOME 1.3 % IJ SUSP
INTRAMUSCULAR | Status: AC
  Filled 2024-08-30: qty 10

## 2024-08-30 MED ORDER — DEXAMETHASONE SOD PHOSPHATE PF 10 MG/ML IJ SOLN
INTRAMUSCULAR | Status: DC | PRN
  Administered 2024-08-30: 10 mg via INTRAVENOUS

## 2024-08-30 MED ORDER — CHLORHEXIDINE GLUCONATE 0.12 % MT SOLN
15.0000 mL | Freq: Once | OROMUCOSAL | Status: AC
  Administered 2024-08-30: 15 mL via OROMUCOSAL

## 2024-08-30 MED ORDER — GLYCOPYRROLATE 0.2 MG/ML IJ SOLN
INTRAMUSCULAR | Status: DC | PRN
  Administered 2024-08-30: .1 mg via INTRAVENOUS

## 2024-08-30 MED ORDER — ACETAMINOPHEN 500 MG PO TABS
ORAL_TABLET | ORAL | Status: AC
  Filled 2024-08-30: qty 2

## 2024-08-30 MED ORDER — IBUPROFEN 600 MG PO TABS: 600.0000 mg | ORAL_TABLET | Freq: Three times a day (TID) | ORAL | 1 refills | Status: AC | PRN

## 2024-08-30 MED ORDER — FENTANYL CITRATE (PF) 100 MCG/2ML IJ SOLN
INTRAMUSCULAR | Status: DC | PRN
  Administered 2024-08-30 (×2): 50 ug via INTRAVENOUS

## 2024-08-30 MED ORDER — GABAPENTIN 300 MG PO CAPS
ORAL_CAPSULE | ORAL | Status: AC
Start: 2024-08-30 — End: 2024-08-30
  Filled 2024-08-30: qty 1

## 2024-08-30 MED ORDER — BUPIVACAINE-EPINEPHRINE 0.5% -1:200000 IJ SOLN
INTRAMUSCULAR | Status: DC | PRN
  Administered 2024-08-30: 40 mL

## 2024-08-30 MED ORDER — CEFAZOLIN SODIUM-DEXTROSE 2-4 GM/100ML-% IV SOLN
2.0000 g | INTRAVENOUS | Status: AC
  Administered 2024-08-30: 2 g via INTRAVENOUS

## 2024-08-30 MED ORDER — LIDOCAINE HCL (PF) 2 % IJ SOLN
INTRAMUSCULAR | Status: AC
  Filled 2024-08-30: qty 5

## 2024-08-30 MED ORDER — ONDANSETRON HCL 4 MG/2ML IJ SOLN
INTRAMUSCULAR | Status: AC
  Filled 2024-08-30: qty 2

## 2024-08-30 MED ORDER — CEFAZOLIN SODIUM-DEXTROSE 2-4 GM/100ML-% IV SOLN
INTRAVENOUS | Status: AC
  Filled 2024-08-30: qty 100

## 2024-08-30 MED ORDER — ACETAMINOPHEN 500 MG PO TABS
1000.0000 mg | ORAL_TABLET | ORAL | Status: AC
  Administered 2024-08-30: 1000 mg via ORAL

## 2024-08-30 MED ORDER — CHLORHEXIDINE GLUCONATE CLOTH 2 % EX PADS
6.0000 | MEDICATED_PAD | Freq: Once | CUTANEOUS | Status: AC
  Administered 2024-08-30: 6 via TOPICAL

## 2024-08-30 MED ORDER — DEXMEDETOMIDINE HCL IN NACL 80 MCG/20ML IV SOLN
INTRAVENOUS | Status: DC | PRN
Start: 2024-08-30 — End: 2024-08-30
  Administered 2024-08-30 (×2): 8 ug via INTRAVENOUS

## 2024-08-30 MED ORDER — MIDAZOLAM HCL 2 MG/2ML IJ SOLN
INTRAMUSCULAR | Status: AC
  Filled 2024-08-30: qty 2

## 2024-08-30 MED ORDER — FENTANYL CITRATE (PF) 100 MCG/2ML IJ SOLN
INTRAMUSCULAR | Status: AC
  Filled 2024-08-30: qty 2

## 2024-08-30 MED ORDER — LACTATED RINGERS IV SOLN: INTRAVENOUS | Status: DC

## 2024-08-30 MED ORDER — LIDOCAINE HCL (CARDIAC) PF 100 MG/5ML IV SOSY
PREFILLED_SYRINGE | INTRAVENOUS | Status: DC | PRN
  Administered 2024-08-30: 100 mg via INTRAVENOUS

## 2024-08-30 MED ORDER — CHLORHEXIDINE GLUCONATE 0.12 % MT SOLN
OROMUCOSAL | Status: AC
  Filled 2024-08-30: qty 15

## 2024-08-30 MED ORDER — FENTANYL CITRATE (PF) 100 MCG/2ML IJ SOLN: 25.0000 ug | INTRAMUSCULAR | Status: DC | PRN

## 2024-08-30 MED ORDER — OXYCODONE HCL 5 MG/5ML PO SOLN: 5.0000 mg | Freq: Once | ORAL | Status: AC | PRN

## 2024-08-30 MED ORDER — PROPOFOL 10 MG/ML IV BOLUS
INTRAVENOUS | Status: DC | PRN
Start: 2024-08-30 — End: 2024-08-30
  Administered 2024-08-30: 170 mg via INTRAVENOUS

## 2024-08-30 MED ORDER — ACETAMINOPHEN 500 MG PO TABS: 1000.0000 mg | ORAL_TABLET | Freq: Four times a day (QID) | ORAL | Status: AC | PRN

## 2024-08-30 MED ORDER — GLYCOPYRROLATE 0.2 MG/ML IJ SOLN
INTRAMUSCULAR | Status: AC
  Filled 2024-08-30: qty 1

## 2024-08-30 MED ORDER — ONDANSETRON HCL 4 MG/2ML IJ SOLN
INTRAMUSCULAR | Status: DC | PRN
  Administered 2024-08-30: 4 mg via INTRAVENOUS

## 2024-08-30 MED ORDER — ORAL CARE MOUTH RINSE: 15.0000 mL | Freq: Once | OROMUCOSAL | Status: AC

## 2024-08-30 MED ORDER — GABAPENTIN 300 MG PO CAPS
300.0000 mg | ORAL_CAPSULE | ORAL | Status: AC
  Administered 2024-08-30: 300 mg via ORAL

## 2024-08-30 MED ORDER — ROCURONIUM BROMIDE 100 MG/10ML IV SOLN
INTRAVENOUS | Status: DC | PRN
  Administered 2024-08-30: 30 mg via INTRAVENOUS
  Administered 2024-08-30: 60 mg via INTRAVENOUS
  Administered 2024-08-30: 10 mg via INTRAVENOUS

## 2024-08-30 MED ORDER — BUPIVACAINE-EPINEPHRINE (PF) 0.5% -1:200000 IJ SOLN
INTRAMUSCULAR | Status: AC
  Filled 2024-08-30: qty 30

## 2024-08-30 MED ORDER — SUGAMMADEX SODIUM 200 MG/2ML IV SOLN
INTRAVENOUS | Status: DC | PRN
  Administered 2024-08-30: 200 mg via INTRAVENOUS

## 2024-08-30 MED ORDER — OXYCODONE HCL 5 MG PO TABS
ORAL_TABLET | ORAL | Status: AC
  Filled 2024-08-30: qty 1

## 2024-08-30 MED ORDER — OXYCODONE HCL 5 MG PO TABS: 5.0000 mg | ORAL_TABLET | ORAL | 0 refills | Status: AC | PRN

## 2024-08-30 MED ORDER — ROCURONIUM BROMIDE 10 MG/ML (PF) SYRINGE
PREFILLED_SYRINGE | INTRAVENOUS | Status: AC
  Filled 2024-08-30: qty 10

## 2024-08-30 SURGICAL SUPPLY — 42 items
COVER TIP SHEARS 8 DVNC (MISCELLANEOUS) ×1 IMPLANT
COVER WAND RF STERILE (DRAPES) ×1 IMPLANT
DEFOGGER SCOPE WARM SEASHARP (MISCELLANEOUS) ×1 IMPLANT
DERMABOND ADVANCED .7 DNX12 (GAUZE/BANDAGES/DRESSINGS) ×1 IMPLANT
DRAPE ARM DVNC X/XI (DISPOSABLE) ×3 IMPLANT
DRAPE COLUMN DVNC XI (DISPOSABLE) ×1 IMPLANT
ELECTRODE CAUTERY BLDE TIP 2.5 (TIP) ×1 IMPLANT
ELECTRODE REM PT RTRN 9FT ADLT (ELECTROSURGICAL) ×1 IMPLANT
FORCEPS BPLR R/ABLATION 8 DVNC (INSTRUMENTS) ×1 IMPLANT
GLOVE SURG SYN 7.0 PF PI (GLOVE) ×2 IMPLANT
GLOVE SURG SYN 7.5 PF PI (GLOVE) ×2 IMPLANT
GOWN STRL REUS W/ TWL LRG LVL3 (GOWN DISPOSABLE) ×4 IMPLANT
IRRIGATION STRYKERFLOW (MISCELLANEOUS) IMPLANT
IV 0.9% NACL 1000 ML (IV SOLUTION) IMPLANT
IV CATH ANGIO 12GX3 LT BLUE (NEEDLE) IMPLANT
KIT PINK PAD W/HEAD ARM REST (MISCELLANEOUS) ×1 IMPLANT
LABEL OR SOLS (LABEL) ×1 IMPLANT
MANIFOLD NEPTUNE II (INSTRUMENTS) ×1 IMPLANT
MESH 3DMAX MID 4X6 RT LRG (Mesh General) IMPLANT
NDL DRIVE SUT CUT DVNC (INSTRUMENTS) ×1 IMPLANT
NDL HYPO 22X1.5 SAFETY MO (MISCELLANEOUS) ×1 IMPLANT
NDL INSUFFLATION 14GA 120MM (NEEDLE) ×1 IMPLANT
NEEDLE DRIVE SUT CUT DVNC (INSTRUMENTS) ×1 IMPLANT
NEEDLE HYPO 22X1.5 SAFETY MO (MISCELLANEOUS) ×1 IMPLANT
NEEDLE INSUFFLATION 14GA 120MM (NEEDLE) ×1 IMPLANT
OBTURATOR OPTICALSTD 8 DVNC (TROCAR) ×1 IMPLANT
PACK LAP CHOLECYSTECTOMY (MISCELLANEOUS) ×1 IMPLANT
SCISSORS MNPLR CVD DVNC XI (INSTRUMENTS) ×1 IMPLANT
SEAL UNIV 5-12 XI (MISCELLANEOUS) ×3 IMPLANT
SET TUBE SMOKE EVAC HIGH FLOW (TUBING) ×1 IMPLANT
SOLUTION ELECTROSURG ANTI STCK (MISCELLANEOUS) ×1 IMPLANT
SPONGE T-LAP 18X18 ~~LOC~~+RFID (SPONGE) ×1 IMPLANT
SUT MNCRL AB 4-0 PS2 18 (SUTURE) ×1 IMPLANT
SUT STRATA 2-0 23CM CT-2 (SUTURE) ×1 IMPLANT
SUT VIC AB 2-0 SH 27XBRD (SUTURE) ×1 IMPLANT
SUT VIC AB 3-0 SH 27X BRD (SUTURE) ×1 IMPLANT
SUT VICRYL 0 UR6 27IN ABS (SUTURE) ×2 IMPLANT
SYSTEM BAG RETRIEVAL 10MM (BASKET) IMPLANT
TAPE TRANSPORE STRL 2 31045 (GAUZE/BANDAGES/DRESSINGS) ×1 IMPLANT
TRAP FLUID SMOKE EVACUATOR (MISCELLANEOUS) ×1 IMPLANT
TRAY FOLEY SLVR 16FR LF STAT (SET/KITS/TRAYS/PACK) ×1 IMPLANT
WATER STERILE IRR 500ML POUR (IV SOLUTION) ×1 IMPLANT

## 2024-08-30 NOTE — Interval H&P Note (Signed)
 History and Physical Interval Note:  08/30/2024 7:04 AM  Keith York  has presented today for surgery, with the diagnosis of right inguinal hernia initial reducible.  The various methods of treatment have been discussed with the patient and family. After consideration of risks, benefits and other options for treatment, the patient has consented to  Procedure(s) with comments: ROBOT ASSISTED INGUINAL HERNIA REPAIR (Right) - Possible bilateral as a surgical intervention.  The patient's history has been reviewed, patient examined, no change in status, stable for surgery.  I have reviewed the patient's chart and labs.  Questions were answered to the patient's satisfaction.     Mellina Benison

## 2024-08-30 NOTE — Discharge Instructions (Signed)
 Discharge Instructions: 1.  Patient may shower, but do not scrub wounds heavily and dab dry only. 2.  Do not submerge wounds in pool/tub until fully healed. 3.  Do not apply ointments or hydrogen peroxide to the wounds. 4.  May apply ice packs to the wounds for comfort. 5.  It is normal for there to be bruising and swelling in the groin and scrotum.  This will resolve on its own. 6.  Do not drive while taking narcotics for pain control.  Prior to driving, make sure you are able to rotate right and left to look at blindspots without significant pain or discomfort. 7.  No heavy lifting or pushing of more than 10-15 lbs for 6 weeks.

## 2024-08-30 NOTE — Anesthesia Procedure Notes (Signed)
 Procedure Name: Intubation Date/Time: 08/30/2024 7:36 AM  Performed by: Jackye Spanner, CRNAPre-anesthesia Checklist: Patient identified, Patient being monitored, Timeout performed, Emergency Drugs available and Suction available Patient Re-evaluated:Patient Re-evaluated prior to induction Oxygen Delivery Method: Circle system utilized Preoxygenation: Pre-oxygenation with 100% oxygen Induction Type: IV induction Ventilation: Mask ventilation without difficulty, Oral airway inserted - appropriate to patient size and Two handed mask ventilation required Laryngoscope Size: 3 and McGrath Grade View: Grade I Tube type: Oral Tube size: 7.0 mm Number of attempts: 1 Airway Equipment and Method: Stylet Placement Confirmation: ETT inserted through vocal cords under direct vision, positive ETCO2 and breath sounds checked- equal and bilateral Secured at: 22 cm Tube secured with: Tape Dental Injury: Teeth and Oropharynx as per pre-operative assessment  Comments: Smooth atraumatic intubation, no complications noted.

## 2024-08-30 NOTE — Anesthesia Preprocedure Evaluation (Signed)
 Anesthesia Evaluation  Patient identified by MRN, date of birth, ID band Patient awake    Reviewed: Allergy & Precautions, NPO status , Patient's Chart, lab work & pertinent test results  History of Anesthesia Complications Negative for: history of anesthetic complications  Airway Mallampati: III  TM Distance: >3 FB Neck ROM: full    Dental  (+) Chipped   Pulmonary neg shortness of breath, former smoker   Pulmonary exam normal        Cardiovascular Exercise Tolerance: Good hypertension, (-) angina (-) Past MI and (-) DOE negative cardio ROS Normal cardiovascular exam     Neuro/Psych negative neurological ROS  negative psych ROS   GI/Hepatic negative GI ROS, Neg liver ROS,neg GERD  ,,  Endo/Other  negative endocrine ROS    Renal/GU      Musculoskeletal   Abdominal   Peds  Hematology negative hematology ROS (+)   Anesthesia Other Findings Past Medical History: No date: Allergy No date: Family history of colonic polyps No date: Family history of Lynch syndrome No date: Sinus congestion  Past Surgical History: 1995: WISDOM TOOTH EXTRACTION  BMI    Body Mass Index: 31.39 kg/m      Reproductive/Obstetrics negative OB ROS                              Anesthesia Physical Anesthesia Plan  ASA: 2  Anesthesia Plan: General ETT   Post-op Pain Management:    Induction: Intravenous  PONV Risk Score and Plan: 3 and Ondansetron , Dexamethasone and Midazolam   Airway Management Planned: Oral ETT  Additional Equipment:   Intra-op Plan:   Post-operative Plan: Extubation in OR  Informed Consent: I have reviewed the patients History and Physical, chart, labs and discussed the procedure including the risks, benefits and alternatives for the proposed anesthesia with the patient or authorized representative who has indicated his/her understanding and acceptance.     Dental Advisory  Given  Plan Discussed with: Anesthesiologist, CRNA and Surgeon  Anesthesia Plan Comments: (Patient consented for risks of anesthesia including but not limited to:  - adverse reactions to medications - damage to eyes, teeth, lips or other oral mucosa - nerve damage due to positioning  - sore throat or hoarseness - Damage to heart, brain, nerves, lungs, other parts of body or loss of life  Patient voiced understanding and assent.)        Anesthesia Quick Evaluation

## 2024-08-30 NOTE — Transfer of Care (Signed)
 Immediate Anesthesia Transfer of Care Note  Patient: Keith York  Procedure(s) Performed: ROBOT ASSISTED INGUINAL HERNIA REPAIR (Right)  Patient Location: PACU  Anesthesia Type:General  Level of Consciousness: drowsy  Airway & Oxygen Therapy: Patient Spontanous Breathing and Patient connected to face mask oxygen  Post-op Assessment: Report given to RN and Post -op Vital signs reviewed and stable  Post vital signs: Reviewed and stable  Last Vitals:  Vitals Value Taken Time  BP 138/83 08/30/24 09:56  Temp 37.3 C 08/30/24 09:56  Pulse 78 08/30/24 09:59  Resp 22 08/30/24 09:59  SpO2 95 % 08/30/24 09:59  Vitals shown include unfiled device data.  Last Pain:  Vitals:   08/30/24 0622  TempSrc: Temporal  PainSc: 0-No pain         Complications: No notable events documented.

## 2024-08-30 NOTE — Op Note (Signed)
 Procedure Date:  08/30/2024  Pre-operative Diagnosis:  Right inguinal hernia  Post-operative Diagnosis: Right inguinal hernia  Procedure: 1.  Robotic assisted right Inguinal Hernia Repair 2.  Creation of a right Posterior Rectus-Transversalis Fascia Advancment Flap for Coverage of Pelvic Wound (200 cm)  Surgeon:  Aloysius Sheree Plant, MD  Anesthesia:  General endotracheal  Estimated Blood Loss:  30 ml  Specimens:  None  Complications:  None  Indications for Procedure:  This is a 53 y.o. male who presents with a right inguinal hernia.  The options of surgery versus observation were reviewed with the patient and/or family. The risks of bleeding, abscess or infection, recurrence of symptoms, potential for an open procedure, injury to surrounding structures, and chronic pain were all discussed with the patient and he was willing to proceed.  We have planned this transabdominal procedure with the creation of a right peritoneal flap based on the posterior rectus sheath and transversalis fascia in order to fully cover the mesh, creating a natural tisssue barrier for the bowel and peritoneal cavity.  Description of Procedure: The patient was correctly identified in the preoperative area and brought into the operating room.  The patient was placed supine with VTE prophylaxis in place.  Appropriate time-outs were performed.  Anesthesia was induced and the patient was intubated.  Foley catheter was placed.  Appropriate antibiotics were infused.  The abdomen was prepped and draped in a sterile fashion. A supraumbilical incision was made. A cutdown technique was used to enter the abdominal cavity without injury, and a Hasson trocar was inserted.  Pneumoperitoneum was obtained with appropriate opening pressures.  A Veress needle was used to start dissecting the peritoneal flap.  Two 8-mm robotic ports were placed in the right and left lateral positions under direct visualization.  A large right Bard 3D  Max Mesh, a 2-0 Vicryl, and 2-0 vlock suture were placed through the umbilical port under direct visualization.  The [employer] platform was docked onto the patient, the camera was inserted and targeted, and the instruments were placed under direct visualization.  Both inguinal regions were inspected for hernias and it was confirmed that the patient had a right inguinal hernia.  Using electocautery, the peritoneal and posterior rectus tissue flap was created.  The peritoneum on the right side was scored from the median umbilical ligament laterally towards the ASIS.  The flap was mobilized using robotic scissors and the bipolar instruments, creating a plane along the posterior rectus sheath and transversalis fascia down to the pubic tubercle medially. It was then further mobilized laterally across the inguinal canal and femoral vessels and onto the psoas muscle. The inferior epigastric vessels were identified and preserved. This created a posterior rectus and peritoneal flap measuring roughly 17 cm x 12 cm.  The patient had a right direct hernia and also had a cord lipoma.  The hernia sac and contents were reduced preserving all structures.  The cord lipoma was resected.  A large right Bard 3D Max Mid mesh was placed with good overlap along all the potential hernia defects and secured in place with 2-0 Vicryl along the medial superomedial and superolateral aspects.  Then, the peritoneal flap was advanced over the mesh and carried over to close the defect. A running 2-0 V lock suture was used to approximate the edge of the flap onto the peritoneum.  All needles were removed under direct visualization.  An angiocath was inserted in the right groin to decompress the preperitoneal air.  The 8- mm  ports were removed under direct visualization and the Hasson trocar was removed.  The fascial opening was closed using 0 vicryl suture.  Local anesthetic was infused in all incisions as well as a right ilioinguinal block, and  the incisions were closed with 4-0 Monocryl.  The wounds were cleaned and sealed with DermaBond.  Foley catheter was removed and the patient was emerged from anesthesia and extubated and brought to the recovery room for further management.  The patient tolerated the procedure well and all counts were correct at the end of the case.   Aloysius Sheree Plant, MD

## 2024-08-31 ENCOUNTER — Encounter: Payer: Self-pay | Admitting: Surgery

## 2024-08-31 NOTE — Anesthesia Postprocedure Evaluation (Signed)
 Anesthesia Post Note  Patient: Keith York  Procedure(s) Performed: ROBOT ASSISTED INGUINAL HERNIA REPAIR (Right)  Patient location during evaluation: PACU Anesthesia Type: General Level of consciousness: awake and alert Pain management: pain level controlled Vital Signs Assessment: post-procedure vital signs reviewed and stable Respiratory status: spontaneous breathing, nonlabored ventilation, respiratory function stable and patient connected to nasal cannula oxygen Cardiovascular status: blood pressure returned to baseline and stable Postop Assessment: no apparent nausea or vomiting Anesthetic complications: no   No notable events documented.   Last Vitals:  Vitals:   08/30/24 1057 08/30/24 1116  BP: (!) 145/92 (!) 148/88  Pulse: 81 79  Resp: (!) 22 18  Temp: 36.8 C 36.7 C  SpO2: 96% 96%    Last Pain:  Vitals:   08/30/24 1116  TempSrc: Temporal  PainSc: 0-No pain                 Debby Mines

## 2024-09-12 ENCOUNTER — Encounter: Payer: Self-pay | Admitting: Physician Assistant

## 2024-09-12 ENCOUNTER — Ambulatory Visit: Payer: Self-pay | Admitting: Physician Assistant

## 2024-09-12 VITALS — BP 132/89 | HR 91 | Ht 71.0 in | Wt 244.0 lb

## 2024-09-12 DIAGNOSIS — K4090 Unilateral inguinal hernia, without obstruction or gangrene, not specified as recurrent: Secondary | ICD-10-CM

## 2024-09-12 DIAGNOSIS — Z09 Encounter for follow-up examination after completed treatment for conditions other than malignant neoplasm: Secondary | ICD-10-CM

## 2024-09-12 NOTE — Patient Instructions (Signed)

## 2024-09-12 NOTE — Progress Notes (Signed)
 [facility] POST-OP OFFICE VISIT  09/12/2024  HPI: Keith York is a 53 y.o. male 13 days s/p robotic assisted laparoscopic right inguinal hernia repair with Dr Desiderio  He is doing very well  Abdominal pain worse for about 5 days; now pain free aside from certain movements No fever, chills, nausea, emesis  Tolerating PO; no constipation No issues with incisions  No other complaints  Vital signs: BP (!) 160/94   Pulse 91   Ht 5' 11 (1.803 m)   Wt 244 lb (110.7 kg)   SpO2 97%   BMI 34.03 kg/m    Physical Exam: Constitutional: Well appearing male, NAD Abdomen: Soft, non-tender, non-distended, no rebound/guarding Skin: Laparoscopic incisions are healing well, no erythema or drainage   Assessment/Plan: This is a 53 y.o. male 13 days s/p robotic assisted laparoscopic right inguinal hernia repair with Dr Desiderio   - Pain control prn  - Reviewed wound care recommendation  - Reviewed lifting restrictions; 6 weeks total  - He can follow up on as needed basis; He understands to call with questions/concerns  -- Arthea Platt, PA-C [facility] 09/12/2024, 10:44 AM M-F: 7am - 4pm

## 2024-09-19 ENCOUNTER — Encounter: Payer: Self-pay | Admitting: Surgery
